# Patient Record
Sex: Female | Born: 1980
Health system: Southern US, Community
[De-identification: ages and names within clinical notes are randomized; demographics above are authoritative.]

## PROBLEM LIST (undated history)

## (undated) DIAGNOSIS — D649 Anemia, unspecified: Secondary | ICD-10-CM

## (undated) DIAGNOSIS — I10 Essential (primary) hypertension: Secondary | ICD-10-CM

## (undated) HISTORY — PX: HYSTEROSCOPY WITH D & C: SHX1775

## (undated) HISTORY — DX: Anemia, unspecified: D64.9

---

## 2010-04-27 ENCOUNTER — Emergency Department (HOSPITAL_COMMUNITY): Admission: EM | Admit: 2010-04-27 | Discharge: 2010-04-27 | Payer: Self-pay | Admitting: Emergency Medicine

## 2015-03-14 ENCOUNTER — Emergency Department (HOSPITAL_BASED_OUTPATIENT_CLINIC_OR_DEPARTMENT_OTHER)
Admission: EM | Admit: 2015-03-14 | Discharge: 2015-03-14 | Disposition: A | Discharge status: Home / Self Care | Payer: Commercial Managed Care - PPO | Attending: Emergency Medicine | Admitting: Emergency Medicine

## 2015-03-14 ENCOUNTER — Encounter (HOSPITAL_BASED_OUTPATIENT_CLINIC_OR_DEPARTMENT_OTHER): Payer: Self-pay | Admitting: *Deleted

## 2015-03-14 DIAGNOSIS — I10 Essential (primary) hypertension: Secondary | ICD-10-CM | POA: Insufficient documentation

## 2015-03-14 DIAGNOSIS — L0201 Cutaneous abscess of face: Secondary | ICD-10-CM

## 2015-03-14 DIAGNOSIS — Z87891 Personal history of nicotine dependence: Secondary | ICD-10-CM | POA: Diagnosis not present

## 2015-03-14 DIAGNOSIS — Z792 Long term (current) use of antibiotics: Secondary | ICD-10-CM | POA: Insufficient documentation

## 2015-03-14 DIAGNOSIS — Z79899 Other long term (current) drug therapy: Secondary | ICD-10-CM | POA: Insufficient documentation

## 2015-03-14 DIAGNOSIS — R220 Localized swelling, mass and lump, head: Secondary | ICD-10-CM | POA: Diagnosis present

## 2015-03-14 HISTORY — DX: Essential (primary) hypertension: I10

## 2015-03-14 NOTE — ED Notes (Signed)
Pt with swelling noted to inferior left eye with tracking noted from know abscess to left temple. pt denies any visual changes.

## 2015-03-14 NOTE — ED Provider Notes (Signed)
CSN: [id number]     Arrival date & time 03/14/15  1348 History  This chart was scribed for Kathryn Williams Keniesha Adderly, MD by Ronney LionSuzanne Le, ED Scribe. This patient was seen in room MH09/MH09 and the patient's care was started at 6:10 PM.    Chief Complaint  Patient presents with  . Facial Swelling   The history is provided by the patient. No language interpreter was used.     HPI Comments: Kathryn DeutscherSanah Tinoco is a 34 y.o. female who presents to the Emergency Department complaining of worsening, left sided facial swelling that began 3 days ago and has spread to her left eye. She had gone to her PCP, who sent her to an ENT specialist; he gave her a steroid injection and prescribed amoxicillin BID, which she has been taking. She is also taking Tylenol at home. The ENT specialist told her he thought it was a cyst.    Past Medical History  Diagnosis Date  . Hypertension    History reviewed. No pertinent past surgical history. No family history on file. History  Substance Use Topics  . Smoking status: Former Games developermoker  . Smokeless tobacco: Not on file  . Alcohol Use: No   OB History    No data available     Review of Systems  A complete 10 system review of systems was obtained and all systems are negative except as noted in the HPI and PMH.    Allergies  Review of patient's allergies indicates no known allergies.  Home Medications   Prior to Admission medications   Medication Sig Start Date End Date Taking? Authorizing Provider  amoxicillin (AMOXIL) 875 MG tablet Take 875 mg by mouth 2 (two) times daily.   Yes Historical Provider, MD  spironolactone (ALDACTONE) 25 MG tablet Take 25 mg by mouth daily.   Yes Historical Provider, MD   BP 148/90 mmHg  Pulse 93  Temp(Src) 98.8 F (37.1 C)  Resp 18  Ht 5\' 8"  (1.727 m)  Wt 250 lb (113.399 kg)  BMI 38.02 kg/m2  SpO2 100%  LMP 01/16/2015 Physical Exam  Constitutional: She is oriented to person, place, and time. She appears well-developed and  well-nourished. No distress.  HENT:  Head: Normocephalic and atraumatic.  Eyes: Conjunctivae and EOM are normal.  Neck: Neck supple. No tracheal deviation present.  Cardiovascular: Normal rate.   Pulmonary/Chest: Effort normal. No respiratory distress.  Musculoskeletal: Normal range of motion.  Neurological: She is alert and oriented to person, place, and time.  Skin: Skin is warm and dry. No rash noted.  Golf ball sized abscess with pointing, induration, and fluctuance. Warmth, without surrounding erythema. Mild swelling in the infraorbital area.   Psychiatric: She has a normal mood and affect. Her behavior is normal.  Nursing note and vitals reviewed.   ED Course  Procedures (including critical care time)  DIAGNOSTIC STUDIES: Oxygen Saturation is 100% on room air, normal by my interpretation.    COORDINATION OF CARE: 6:14 PM - Discussed treatment plan with pt at bedside which includes I&D, and pt agreed to plan.   INCISION AND DRAINAGE PROCEDURE NOTE: Patient identification was confirmed and verbal consent was obtained. This procedure was performed by Kathryn Williams Galit Urich, MD at 6:16 PM. Site: left face  Needle size: 18 Anesthetic used (type and amt): Lidocaine 2% with epi Blade size: 18-gauge needle Drainage: 5 mL Complexity: Simple and no packing used Site anesthetized, incision made over site, wound drained and explored loculations, rinsed with copious amounts of normal saline,  wound packed with sterile gauze, covered with dry, sterile dressing.  Pt tolerated procedure well without complications.  Instructions for care discussed verbally and pt provided with additional written instructions for homecare and f/u. `  Labs Review Labs Reviewed - No data to display  Imaging Review No results found.   EKG Interpretation None      MDM   Final diagnoses:  Facial abscess    Patient with a facial abscess drained with an 18-gauge needle. No significant cellulitis. I  personally performed the services described in this documentation, which was scribed in my presence.  The recorded information has been reviewed and considered.    Kathryn Sprout, MD 03/15/15 0020

## 2015-03-14 NOTE — ED Notes (Signed)
Pt with swelling between right eye and right ear.  Pt was seen by ENT for cyst and was placed on amoxicillin and given steroid injection. Pt continues to have increased swelling with swelling noted under the right eye too.  No fever or chills with this

## 2015-03-14 NOTE — Discharge Instructions (Signed)
Abscess °An abscess (boil or furuncle) is an infected area on or under the skin. This area is filled with yellowish-white fluid (pus) and other material (debris). °HOME CARE  °· Only take medicines as told by your doctor. °· If you were given antibiotic medicine, take it as directed. Finish the medicine even if you start to feel better. °· If gauze is used, follow your doctor's directions for changing the gauze. °· To avoid spreading the infection: °· Keep your abscess covered with a bandage. °· Wash your hands well. °· Do not share personal care items, towels, or whirlpools with others. °· Avoid skin contact with others. °· Keep your skin and clothes clean around the abscess. °· Keep all doctor visits as told. °GET HELP RIGHT AWAY IF:  °· You have more pain, puffiness (swelling), or redness in the wound site. °· You have more fluid or blood coming from the wound site. °· You have muscle aches, chills, or you feel sick. °· You have a fever. °MAKE SURE YOU:  °· Understand these instructions. °· Will watch your condition. °· Will get help right away if you are not doing well or get worse. °Document Released: 04/24/2008 Document Revised: 05/07/2012 Document Reviewed: 01/19/2012 °ExitCare® Patient Information ©2015 [employer]. This information is not intended to replace advice given to you by your health care provider. Make sure you discuss any questions you have with your health care provider. ° °Abscess °Care After °An abscess (also called a boil or furuncle) is an infected area that contains a collection of pus. Signs and symptoms of an abscess include pain, tenderness, redness, or hardness, or you may feel a moveable soft area under your skin. An abscess can occur anywhere in the body. The infection may spread to surrounding tissues causing cellulitis. A cut (incision) by the surgeon was made over your abscess and the pus was drained out. Gauze may have been packed into the space to provide a drain that will  allow the cavity to heal from the inside outwards. The boil may be painful for 5 to 7 days. Most people with a boil do not have high fevers. Your abscess, if seen early, may not have localized, and may not have been lanced. If not, another appointment may be required for this if it does not get better on its own or with medications. °HOME CARE INSTRUCTIONS  °· Only take over-the-counter or prescription medicines for pain, discomfort, or fever as directed by your caregiver. °· When you bathe, soak and then remove gauze or iodoform packs at least daily or as directed by your caregiver. You may then wash the wound gently with mild soapy water. Repack with gauze or do as your caregiver directs. °SEEK IMMEDIATE MEDICAL CARE IF:  °· You develop increased pain, swelling, redness, drainage, or bleeding in the wound site. °· You develop signs of generalized infection including muscle aches, chills, fever, or a general ill feeling. °· An oral temperature above 102° F (38.9° C) develops, not controlled by medication. °See your caregiver for a recheck if you develop any of the symptoms described above. If medications (antibiotics) were prescribed, take them as directed. °Document Released: 05/25/2005 Document Revised: 01/29/2012 Document Reviewed: 01/20/2008 °ExitCare® Patient Information ©2015 [employer]. This information is not intended to replace advice given to you by your health care provider. Make sure you discuss any questions you have with your health care provider. ° °

## 2017-02-28 ENCOUNTER — Ambulatory Visit
Admission: EM | Admit: 2017-02-28 | Discharge: 2017-02-28 | Disposition: A | Discharge status: Home / Self Care | Payer: BLUE CROSS/BLUE SHIELD | Attending: Family Medicine | Admitting: Family Medicine

## 2017-02-28 ENCOUNTER — Encounter: Payer: Self-pay | Admitting: *Deleted

## 2017-02-28 DIAGNOSIS — J069 Acute upper respiratory infection, unspecified: Secondary | ICD-10-CM | POA: Diagnosis not present

## 2017-02-28 DIAGNOSIS — B9789 Other viral agents as the cause of diseases classified elsewhere: Secondary | ICD-10-CM

## 2017-02-28 LAB — RAPID STREP SCREEN (MED CTR MEBANE ONLY): Streptococcus, Group A Screen (Direct): NEGATIVE

## 2017-02-28 MED ORDER — LIDOCAINE VISCOUS 2 % MT SOLN: OROMUCOSAL | 0 refills | Status: DC

## 2017-02-28 MED ORDER — HYDROCOD POLST-CPM POLST ER 10-8 MG/5ML PO SUER: 5.0000 mL | Freq: Every evening | ORAL | 0 refills | Status: DC | PRN

## 2017-02-28 NOTE — ED Triage Notes (Signed)
Sore throat, runny nose- clear, head/chest congestion, since yesterday.

## 2017-02-28 NOTE — ED Provider Notes (Signed)
[facility]    CSN: [id number] Arrival date & time: 02/28/17  1718     History   Chief Complaint Chief Complaint  Patient presents with  . Sore Throat  . Cough  . Nasal Congestion    HPI Kathryn Williams is a 36 y.o. female.   The history is provided by the patient.  Sore Throat  Pertinent negatives include no headaches.  Cough  Associated symptoms: no headaches, no myalgias and no wheezing   URI  Presenting symptoms: congestion and cough   Severity:  Moderate Onset quality:  Sudden Duration:  2 days Timing:  Constant Progression:  Worsening Chronicity:  New Relieved by:  None tried Associated symptoms: no arthralgias, no headaches, no myalgias, no sinus pain and no wheezing   Risk factors: sick contacts   Risk factors: not elderly, no chronic cardiac disease, no chronic kidney disease, no chronic respiratory disease, no diabetes mellitus, no immunosuppression, no recent illness and no recent travel     Past Medical History:  Diagnosis Date  . Hypertension     There are no active problems to display for this patient.   History reviewed. No pertinent surgical history.  OB History    No data available       Home Medications    Prior to Admission medications   Medication Sig Start Date End Date Taking? Authorizing Provider  spironolactone (ALDACTONE) 25 MG tablet Take 25 mg by mouth daily.   Yes Historical Provider, MD  amoxicillin (AMOXIL) 875 MG tablet Take 875 mg by mouth 2 (two) times daily.    Historical Provider, MD  chlorpheniramine-HYDROcodone (TUSSIONEX PENNKINETIC ER) 10-8 MG/5ML SUER Take 5 mLs by mouth at bedtime as needed. 02/28/17   Payton Mccallum, MD  lidocaine (XYLOCAINE) 2 % solution 20 ml gargle and spit q 6 hours prn 02/28/17   Payton Mccallum, MD    Family History History reviewed. No pertinent family history.  Social History Social History  Substance Use Topics  . Smoking status: Former Games developer  . Smokeless tobacco: Never Used    . Alcohol use No     Allergies   Patient has no known allergies.   Review of Systems Review of Systems  HENT: Positive for congestion. Negative for sinus pain.   Respiratory: Positive for cough. Negative for wheezing.   Musculoskeletal: Negative for arthralgias and myalgias.  Neurological: Negative for headaches.     Physical Exam Triage Vital Signs ED Triage Vitals  Enc Vitals Group     BP 02/28/17 1738 137/89     Pulse Rate 02/28/17 1738 94     Resp 02/28/17 1738 16     Temp 02/28/17 1738 98.5 F (36.9 C)     Temp Source 02/28/17 1738 Oral     SpO2 02/28/17 1738 100 %     Weight 02/28/17 1740 250 lb (113.4 kg)     Height 02/28/17 1740  (1.727 m)     Head Circumference --      Peak Flow --      Pain Score --      Pain Loc --      Pain Edu? --      Excl. in GC? --    No data found.   Updated Vital Signs BP 137/89 (BP Location: Left Arm)   Pulse 94   Temp 98.5 F (36.9 C) (Oral)   Resp 16   Ht  (1.727 m)   Wt 250 lb (113.4 kg)  LMP 02/14/2017 (Approximate)   SpO2 100%   BMI 38.01 kg/m   Visual Acuity Right Eye Distance:   Left Eye Distance:   Bilateral Distance:    Right Eye Near:   Left Eye Near:    Bilateral Near:     Physical Exam  Constitutional: She appears well-developed and well-nourished. No distress.  HENT:  Head: Normocephalic and atraumatic.  Right Ear: Tympanic membrane, external ear and ear canal normal.  Left Ear: Tympanic membrane, external ear and ear canal normal.  Nose: Mucosal edema and rhinorrhea present. No nose lacerations, sinus tenderness, nasal deformity, septal deviation or nasal septal hematoma. No epistaxis.  No foreign bodies. Right sinus exhibits no maxillary sinus tenderness and no frontal sinus tenderness. Left sinus exhibits no maxillary sinus tenderness and no frontal sinus tenderness.  Mouth/Throat: Uvula is midline, oropharynx is clear and moist and mucous membranes are normal. No oropharyngeal  exudate.  Eyes: Conjunctivae and EOM are normal. Pupils are equal, round, and reactive to light. Right eye exhibits no discharge. Left eye exhibits no discharge. No scleral icterus.  Neck: Normal range of motion. Neck supple. No thyromegaly present.  Cardiovascular: Normal rate, regular rhythm and normal heart sounds.   Pulmonary/Chest: Effort normal and breath sounds normal. No respiratory distress. She has no wheezes. She has no rales.  Lymphadenopathy:    She has no cervical adenopathy.  Skin: She is not diaphoretic.  Nursing note and vitals reviewed.    UC Treatments / Results  Labs (all labs ordered are listed, but only abnormal results are displayed) Labs Reviewed  RAPID STREP SCREEN (NOT AT [facility])  CULTURE, GROUP A STREP [facility])    EKG  EKG Interpretation None       Radiology No results found.  Procedures Procedures (including critical care time)  Medications Ordered in UC Medications - No data to display   Initial Impression / Assessment and Plan / UC Course  I have reviewed the triage vital signs and the nursing notes.  Pertinent labs & imaging results that were available during my care of the patient were reviewed by me and considered in my medical decision making (see chart for details).       Final Clinical Impressions(s) / UC Diagnoses   Final diagnoses:  Viral URI with cough    New Prescriptions Discharge Medication List as of 02/28/2017  6:19 PM    START taking these medications   Details  chlorpheniramine-HYDROcodone (TUSSIONEX PENNKINETIC ER) 10-8 MG/5ML SUER Take 5 mLs by mouth at bedtime as needed., Starting Wed 02/28/2017, Normal    lidocaine (XYLOCAINE) 2 % solution 20 ml gargle and spit q 6 hours prn, Normal       1. Lab results and diagnosis reviewed with patient 2. rx as per orders above; reviewed possible side effects, interactions, risks and benefits  3. Recommend supportive treatment with otc analgesics prn, increased fluids 4.  Follow-up prn if symptoms worsen or don't improve   Payton Mccallum, MD 02/28/17 1936

## 2017-03-03 LAB — CULTURE, GROUP A STREP (THRC)

## 2020-09-21 ENCOUNTER — Ambulatory Visit (HOSPITAL_COMMUNITY)
Admission: EM | Admit: 2020-09-21 | Discharge: 2020-09-21 | Disposition: A | Discharge status: Home / Self Care | Payer: 59 | Attending: Family Medicine | Admitting: Family Medicine

## 2020-09-21 ENCOUNTER — Encounter (HOSPITAL_COMMUNITY): Payer: Self-pay | Admitting: Emergency Medicine

## 2020-09-21 ENCOUNTER — Other Ambulatory Visit: Payer: Self-pay

## 2020-09-21 DIAGNOSIS — Z87891 Personal history of nicotine dependence: Secondary | ICD-10-CM | POA: Insufficient documentation

## 2020-09-21 DIAGNOSIS — Z20822 Contact with and (suspected) exposure to covid-19: Secondary | ICD-10-CM | POA: Diagnosis not present

## 2020-09-21 DIAGNOSIS — J069 Acute upper respiratory infection, unspecified: Secondary | ICD-10-CM | POA: Diagnosis not present

## 2020-09-21 LAB — SARS CORONAVIRUS 2 (TAT 6-24 HRS): SARS Coronavirus 2: NEGATIVE

## 2020-09-21 NOTE — ED Triage Notes (Addendum)
Symptoms started yesterday morning.  Complains of runny nose, sore throat, headache, body aches, and diarrhea  Requesting a work note and covid test for work

## 2020-09-21 NOTE — ED Provider Notes (Signed)
[facility]    CSN: [id number] Arrival date & time: 09/21/20  1417      History   Chief Complaint Chief Complaint  Patient presents with  . Fatigue  . Nasal Congestion    HPI Kathryn Williams is a 39 y.o. female.   Patient presenting today with 2 day hx of fatigue, congestion, sore throat, body aches, chills, cough. Denies known fever, abdominal pain, N/V/D, CP, SOB. Has tried some zyrtec without much relief. Daughter sick with similar sxs right now. No known hx of allergic rhinitis, asthma, or other pulmonary conditions.      Past Medical History:  Diagnosis Date  . Hypertension     There are no problems to display for this patient.   Past Surgical History:  Procedure Laterality Date  . HYSTEROSCOPY WITH D & C      OB History   No obstetric history on file.      Home Medications    Prior to Admission medications   Medication Sig Start Date End Date Taking? Authorizing Provider  amoxicillin (AMOXIL) 875 MG tablet Take 875 mg by mouth 2 (two) times daily.    [provider]  chlorpheniramine-HYDROcodone (TUSSIONEX PENNKINETIC ER) 10-8 MG/5ML SUER Take 5 mLs by mouth at bedtime as needed. 02/28/17   Payton Mccallum, MD  lidocaine (XYLOCAINE) 2 % solution 20 ml gargle and spit q 6 hours prn 02/28/17   Payton Mccallum, MD  spironolactone (ALDACTONE) 25 MG tablet Take 25 mg by mouth daily.    [provider]    Family History Family History  Problem Relation Age of Onset  . Hypertension Mother   . Thyroid disease Mother   . Hyperlipidemia Father     Social History Social History   Tobacco Use  . Smoking status: Former Games developer  . Smokeless tobacco: Never Used  Substance Use Topics  . Alcohol use: No  . Drug use: Never     Allergies   Patient has no known allergies.   Review of Systems Review of Systems PER HPI   Physical Exam Triage Vital Signs ED Triage Vitals  Enc Vitals Group     BP 09/21/20 1501 (!) 132/97      Pulse Rate 09/21/20 1501 97     Resp 09/21/20 1501 20     Temp 09/21/20 1501 99.1 F (37.3 C)     Temp Source 09/21/20 1501 Oral     SpO2 09/21/20 1501 100 %     Weight --      Height --      Head Circumference --      Peak Flow --      Pain Score 09/21/20 1456 4     Pain Loc --      Pain Edu? --      Excl. in GC? --    No data found.  Updated Vital Signs BP (!) 132/97 (BP Location: Left Arm) Comment (BP Location): large cuff  Pulse 97   Temp 99.1 F (37.3 C) (Oral)   Resp 20   LMP 09/03/2020   SpO2 100%   Visual Acuity Right Eye Distance:   Left Eye Distance:   Bilateral Distance:    Right Eye Near:   Left Eye Near:    Bilateral Near:     Physical Exam Vitals and nursing note reviewed.  Constitutional:      Appearance: Normal appearance. She is not ill-appearing.  HENT:     Head: Atraumatic.  Right Ear: Tympanic membrane normal.     Left Ear: Tympanic membrane normal.     Nose: Rhinorrhea present.     Mouth/Throat:     Mouth: Mucous membranes are moist.     Pharynx: Posterior oropharyngeal erythema present.  Eyes:     Extraocular Movements: Extraocular movements intact.     Conjunctiva/sclera: Conjunctivae normal.  Cardiovascular:     Rate and Rhythm: Normal rate and regular rhythm.     Heart sounds: Normal heart sounds.  Pulmonary:     Effort: Pulmonary effort is normal.     Breath sounds: Normal breath sounds. No wheezing or rales.  Abdominal:     General: Bowel sounds are normal. There is no distension.     Palpations: Abdomen is soft.     Tenderness: There is no abdominal tenderness. There is no guarding.  Musculoskeletal:        General: Normal range of motion.     Cervical back: Normal range of motion and neck supple.  Skin:    General: Skin is warm and dry.  Neurological:     Mental Status: She is alert and oriented to person, place, and time.  Psychiatric:        Mood and Affect: Mood normal.        Thought Content: Thought content  normal.        Judgment: Judgment normal.      UC Treatments / Results  Labs (all labs ordered are listed, but only abnormal results are displayed) Labs Reviewed  SARS CORONAVIRUS 2 (TAT 6-24 HRS)    EKG   Radiology No results found.  Procedures Procedures (including critical care time)  Medications Ordered in UC Medications - No data to display  Initial Impression / Assessment and Plan / UC Course  I have reviewed the triage vital signs and the nursing notes.  Pertinent labs & imaging results that were available during my care of the patient were reviewed by me and considered in my medical decision making (see chart for details).     Consistent with viral URI, vitals and exam reassuring. Discussed OTC remedies and supportive care. COVID pcr pending, work note given with isolation protocol reviewed. Return if sxs worsening or not resolving.  Final Clinical Impressions(s) / UC Diagnoses   Final diagnoses:  Viral URI with cough   Discharge Instructions   None    ED Prescriptions    None     PDMP not reviewed this encounter.   Particia Nearing, [place] 09/21/20 1606

## 2021-02-02 LAB — LIPID PANEL
Cholesterol: 141 (ref 0–200)
HDL: 42 (ref 35–70)
LDL Cholesterol: 83
LDl/HDL Ratio: 3.4
Triglycerides: 80 (ref 40–160)

## 2021-02-02 LAB — BASIC METABOLIC PANEL
BUN: 13 (ref 4–21)
CO2: 22 (ref 13–22)
Chloride: 102 (ref 99–108)
Creatinine: 0.7 (ref 0.5–1.1)
Glucose: 82
Potassium: 4.2 mEq/L (ref 3.5–5.1)
Sodium: 138 (ref 137–147)

## 2021-02-02 LAB — HEPATIC FUNCTION PANEL
ALT: 13 U/L (ref 7–35)
AST: 16 (ref 13–35)
Alkaline Phosphatase: 103 (ref 25–125)
Bilirubin, Total: 0.3

## 2021-02-02 LAB — COMPREHENSIVE METABOLIC PANEL
Albumin: 4.2 (ref 3.5–5.0)
Calcium: 8.9 (ref 8.7–10.7)

## 2021-02-02 LAB — CBC AND DIFFERENTIAL
HCT: 37 (ref 36–46)
Hemoglobin: 11.7 — AB (ref 12.0–16.0)
Platelets: 388 10*3/uL (ref 150–400)
WBC: 9.4

## 2021-02-02 LAB — CBC: RBC: 5.04 (ref 3.87–5.11)

## 2021-02-02 LAB — TSH: TSH: 1.65 (ref 0.41–5.90)

## 2021-08-10 ENCOUNTER — Other Ambulatory Visit: Payer: Self-pay

## 2021-08-10 ENCOUNTER — Ambulatory Visit (HOSPITAL_COMMUNITY)
Admission: RE | Admit: 2021-08-10 | Discharge: 2021-08-10 | Disposition: A | Discharge status: Home / Self Care | Payer: PRIVATE HEALTH INSURANCE | Source: Ambulatory Visit | Attending: Emergency Medicine | Admitting: Emergency Medicine

## 2021-08-10 ENCOUNTER — Ambulatory Visit (INDEPENDENT_AMBULATORY_CARE_PROVIDER_SITE_OTHER): Payer: PRIVATE HEALTH INSURANCE

## 2021-08-10 VITALS — BP 118/70 | HR 95 | Temp 98.9°F | Resp 17

## 2021-08-10 DIAGNOSIS — B349 Viral infection, unspecified: Secondary | ICD-10-CM

## 2021-08-10 DIAGNOSIS — R0602 Shortness of breath: Secondary | ICD-10-CM

## 2021-08-10 DIAGNOSIS — R059 Cough, unspecified: Secondary | ICD-10-CM

## 2021-08-10 MED ORDER — PROMETHAZINE-DM 6.25-15 MG/5ML PO SYRP: 5.0000 mL | ORAL_SOLUTION | Freq: Four times a day (QID) | ORAL | 0 refills | Status: DC | PRN

## 2021-08-10 MED ORDER — BENZONATATE 100 MG PO CAPS: 200.0000 mg | ORAL_CAPSULE | Freq: Three times a day (TID) | ORAL | 0 refills | Status: DC | PRN

## 2021-08-10 NOTE — Discharge Instructions (Signed)
You can take the [facility] as needed for cough You can take Promethazine DM as needed for cough.  Do not take this before driving as this can make you sleepy.  You can take Tylenol and/or Ibuprofen as needed for fever reduction and pain relief.   For cough: honey 1/2 to 1 teaspoon (you can dilute the honey in water or another fluid).  You can also use guaifenesin and dextromethorphan for cough. You can use a humidifier for chest congestion and cough.  If you don't have a humidifier, you can sit in the bathroom with the hot shower running.     For sore throat: try warm salt water gargles, cepacol lozenges, throat spray, warm tea or water with lemon/honey, popsicles or ice, or OTC cold relief medicine for throat discomfort.    For congestion: take a daily anti-histamine like Zyrtec, Claritin, and a oral decongestant, such as pseudoephedrine.  You can also use Flonase 1-2 sprays in each nostril daily.    It is important to stay hydrated: drink plenty of fluids (water, gatorade/powerade/pedialyte, juices, or teas) to keep your throat moisturized and help further relieve irritation/discomfort.   Return or go to the Emergency Department if symptoms worsen or do not improve in the next few days.

## 2021-08-10 NOTE — ED Triage Notes (Signed)
Pt presents with non productive cough that causes chest tightness since yesterday.

## 2021-08-10 NOTE — ED Provider Notes (Signed)
[facility]    CSN: [id number] Arrival date & time: 08/10/21  1657      History   Chief Complaint Chief Complaint  Patient presents with   APPOINTMENT : Cough    HPI Kathryn Williams is a 40 y.o. female.   Patient here for evaluation of cough, shortness of breath, and chest tightness that has been ongoing since yesterday.  Patient reports daughter had similar symptoms last week and was diagnosed with RSV.  Reports shortness of breath and chest tightness are worse when coughing.  Reports taking an at home COVID test that was negative.  Reports using OTC medication with minimal symptom relief.  Denies any trauma, injury, or other precipitating event.  Denies any specific alleviating or aggravating factors.  Denies any fevers, chest pain, N/V/D, numbness, tingling, weakness, abdominal pain, or headaches.    The history is provided by the patient.   Past Medical History:  Diagnosis Date   Hypertension     There are no problems to display for this patient.   Past Surgical History:  Procedure Laterality Date   HYSTEROSCOPY WITH D & C      OB History   No obstetric history on file.      Home Medications    Prior to Admission medications   Medication Sig Start Date End Date Taking? Authorizing Provider  benzonatate (TESSALON PERLES) 100 MG capsule Take 2 capsules (200 mg total) by mouth 3 (three) times daily as needed for cough. 08/10/21  Yes Ivette Loyal, NP  promethazine-dextromethorphan (PROMETHAZINE-DM) 6.25-15 MG/5ML syrup Take 5 mLs by mouth 4 (four) times daily as needed for cough. 08/10/21  Yes Ivette Loyal, NP  amoxicillin (AMOXIL) 875 MG tablet Take 875 mg by mouth 2 (two) times daily.    [provider]  lidocaine (XYLOCAINE) 2 % solution 20 ml gargle and spit q 6 hours prn 02/28/17   Payton Mccallum, MD  spironolactone (ALDACTONE) 25 MG tablet Take 25 mg by mouth daily.    [provider]    Family History Family History  Problem  Relation Age of Onset   Hypertension Mother    Thyroid disease Mother    Hyperlipidemia Father     Social History Social History   Tobacco Use   Smoking status: Former   Smokeless tobacco: Never  Substance Use Topics   Alcohol use: No   Drug use: Never     Allergies   Patient has no known allergies.   Review of Systems Review of Systems  HENT:  Positive for congestion.   Respiratory:  Positive for cough, chest tightness and shortness of breath.   All other systems reviewed and are negative.   Physical Exam Triage Vital Signs ED Triage Vitals  Enc Vitals Group     BP 08/10/21 1728 118/70     Pulse Rate 08/10/21 1727 95     Resp 08/10/21 1727 17     Temp 08/10/21 1727 98.9 F (37.2 C)     Temp Source 08/10/21 1727 Oral     SpO2 08/10/21 1727 100 %     Weight --      Height --      Head Circumference --      Peak Flow --      Pain Score 08/10/21 1731 2     Pain Loc --      Pain Edu? --      Excl. in [place]? --    No data found.  Updated Vital Signs BP 118/70   Pulse 95   Temp 98.9 F (37.2 C) (Oral)   Resp 17   LMP 08/01/2021   SpO2 100%   Visual Acuity Right Eye Distance:   Left Eye Distance:   Bilateral Distance:    Right Eye Near:   Left Eye Near:    Bilateral Near:     Physical Exam Vitals and nursing note reviewed.  Constitutional:      General: She is not in acute distress.    Appearance: Normal appearance. She is not ill-appearing, toxic-appearing or diaphoretic.  HENT:     Head: Normocephalic and atraumatic.     Nose: Congestion present.     Mouth/Throat:     Pharynx: No oropharyngeal exudate or posterior oropharyngeal erythema.  Eyes:     Conjunctiva/sclera: Conjunctivae normal.  Cardiovascular:     Rate and Rhythm: Normal rate.     Pulses: Normal pulses.     Heart sounds: Normal heart sounds.  Pulmonary:     Effort: Pulmonary effort is normal.     Breath sounds: Wheezing present. No rales.  Chest:     Chest wall: No  tenderness.  Abdominal:     General: Abdomen is flat.  Musculoskeletal:        General: Normal range of motion.     Cervical back: Normal range of motion.  Skin:    General: Skin is warm and dry.  Neurological:     General: No focal deficit present.     Mental Status: She is alert and oriented to person, place, and time.  Psychiatric:        Mood and Affect: Mood normal.     UC Treatments / Results  Labs (all labs ordered are listed, but only abnormal results are displayed) Labs Reviewed - No data to display  EKG   Radiology DG Chest 2 View  Result Date: 08/10/2021 CLINICAL DATA:  Cough and shortness of breath. EXAM: CHEST - 2 VIEW COMPARISON:  Chest CT 05/01/2021 FINDINGS: The cardiomediastinal contours are normal. Previous right lung airspace disease has resolved. Pulmonary vasculature is normal. No consolidation, pleural effusion, or pneumothorax. No acute osseous abnormalities are seen. Slight wedging of L1 vertebra, appearing chronic. IMPRESSION: No acute chest findings. Previous right lung airspace disease has resolved. Electronically Signed   By: Narda Rutherford M.D.   On: 08/10/2021 18:48    Procedures Procedures (including critical care time)  Medications Ordered in UC Medications - No data to display  Initial Impression / Assessment and Plan / UC Course  I have reviewed the triage vital signs and the nursing notes.  Pertinent labs & imaging results that were available during my care of the patient were reviewed by me and considered in my medical decision making (see chart for details).    Assessment negative for red flags or concerns.  X-ray with no acute cardiopulmonary disease.  This is likely a viral illness.  Recommend Tylenol and/or ibuprofen as needed.  Encourage fluids and rest.  Discussed conservative symptom management as described in discharge instructions.  Follow-up with primary care for reevaluation of soon as possible.  ED follow-up for any red flag  symptoms. Final Clinical Impressions(s) / UC Diagnoses   Final diagnoses:  Viral illness     Discharge Instructions      You can take the Tessalon Perles as needed for cough You can take Promethazine DM as needed for cough.  Do not take this before driving as this can make  you sleepy.  You can take Tylenol and/or Ibuprofen as needed for fever reduction and pain relief.   For cough: honey 1/2 to 1 teaspoon (you can dilute the honey in water or another fluid).  You can also use guaifenesin and dextromethorphan for cough. You can use a humidifier for chest congestion and cough.  If you don't have a humidifier, you can sit in the bathroom with the hot shower running.     For sore throat: try warm salt water gargles, cepacol lozenges, throat spray, warm tea or water with lemon/honey, popsicles or ice, or OTC cold relief medicine for throat discomfort.    For congestion: take a daily anti-histamine like Zyrtec, Claritin, and a oral decongestant, such as pseudoephedrine.  You can also use Flonase 1-2 sprays in each nostril daily.    It is important to stay hydrated: drink plenty of fluids (water, gatorade/powerade/pedialyte, juices, or teas) to keep your throat moisturized and help further relieve irritation/discomfort.   Return or go to the Emergency Department if symptoms worsen or do not improve in the next few days.     ED Prescriptions     Medication Sig Dispense Auth. Provider   benzonatate (TESSALON PERLES) 100 MG capsule Take 2 capsules (200 mg total) by mouth 3 (three) times daily as needed for cough. 20 capsule Ivette Loyal, NP   promethazine-dextromethorphan (PROMETHAZINE-DM) 6.25-15 MG/5ML syrup Take 5 mLs by mouth 4 (four) times daily as needed for cough. 118 mL Ivette Loyal, NP      PDMP not reviewed this encounter.   Ivette Loyal, NP 08/10/21 1910

## 2021-10-24 ENCOUNTER — Ambulatory Visit (HOSPITAL_COMMUNITY)
Admission: EM | Admit: 2021-10-24 | Discharge: 2021-10-24 | Disposition: A | Discharge status: Home / Self Care | Payer: PRIVATE HEALTH INSURANCE | Attending: Emergency Medicine | Admitting: Emergency Medicine

## 2021-10-24 ENCOUNTER — Ambulatory Visit (HOSPITAL_COMMUNITY): Payer: Self-pay

## 2021-10-24 ENCOUNTER — Other Ambulatory Visit: Payer: Self-pay

## 2021-10-24 ENCOUNTER — Encounter (HOSPITAL_COMMUNITY): Payer: Self-pay

## 2021-10-24 DIAGNOSIS — J111 Influenza due to unidentified influenza virus with other respiratory manifestations: Secondary | ICD-10-CM | POA: Diagnosis not present

## 2021-10-24 MED ORDER — DM-GUAIFENESIN ER 30-600 MG PO TB12: 2.0000 | ORAL_TABLET | Freq: Two times a day (BID) | ORAL | 0 refills | Status: DC

## 2021-10-24 MED ORDER — OSELTAMIVIR PHOSPHATE 75 MG PO CAPS: 75.0000 mg | ORAL_CAPSULE | Freq: Two times a day (BID) | ORAL | 0 refills | Status: DC

## 2021-10-24 NOTE — Discharge Instructions (Signed)
They are most likely being caused by influenza A, your symptoms are consistent with the current presentation, influenza is a virus and will steadily improve with time  You may take Tamiflu twice daily for the next 5 days, if you are able to get this medication from the pharmacy then continue supportive treatment, symptoms will improve as the virus works as well after system whether or not you take this medication    You can take Tylenol and/or Ibuprofen as needed for fever reduction and pain relief.   For cough: honey 1/2 to 1 teaspoon (you can dilute the honey in water or another fluid). You can use a humidifier for chest congestion and cough.  If you don't have a humidifier, you can sit in the bathroom with the hot shower running.      For sore throat: try warm salt water gargles, cepacol lozenges, throat spray, warm tea or water with lemon/honey, popsicles or ice, or OTC cold relief medicine for throat discomfort.   For congestion: take a daily anti-histamine like Zyrtec, Claritin, and a oral decongestant, such as pseudoephedrine.  You can also use Flonase 1-2 sprays in each nostril daily.   It is important to stay hydrated: drink plenty of fluids (water, gatorade/powerade/pedialyte, juices, or teas) to keep your throat moisturized and help further relieve irritation/discomfort.

## 2021-10-24 NOTE — ED Provider Notes (Signed)
[facility]    CSN: [id number] Arrival date & time: 10/24/21  1040      History   Chief Complaint Chief Complaint  Patient presents with   Fever   Cough   Nasal Congestion    HPI Kathryn Williams is a 40 y.o. female.   Patient presents with fever, nasal congestion, sore throat, nonproductive cough and increased shortness of breath due to inability to take breath through nose. using antihistamine, which was helpful for treatment of rhinorrhea. Known sick contacts. No pertinent history, denies hypertension. Poor appetite tolerating fluids.   Past Medical History:  Diagnosis Date   Hypertension     There are no problems to display for this patient.   Past Surgical History:  Procedure Laterality Date   HYSTEROSCOPY WITH D & C      OB History   No obstetric history on file.      Home Medications    Prior to Admission medications   Medication Sig Start Date End Date Taking? Authorizing Provider  amoxicillin (AMOXIL) 875 MG tablet Take 875 mg by mouth 2 (two) times daily.    [provider]  benzonatate (TESSALON PERLES) 100 MG capsule Take 2 capsules (200 mg total) by mouth 3 (three) times daily as needed for cough. 08/10/21   Ivette Loyal, NP  lidocaine (XYLOCAINE) 2 % solution 20 ml gargle and spit q 6 hours prn 02/28/17   Payton Mccallum, MD  promethazine-dextromethorphan (PROMETHAZINE-DM) 6.25-15 MG/5ML syrup Take 5 mLs by mouth 4 (four) times daily as needed for cough. 08/10/21   Ivette Loyal, NP  spironolactone (ALDACTONE) 25 MG tablet Take 25 mg by mouth daily.    [provider]    Family History Family History  Problem Relation Age of Onset   Hypertension Mother    Thyroid disease Mother    Hyperlipidemia Father     Social History Social History   Tobacco Use   Smoking status: Former   Smokeless tobacco: Never  Substance Use Topics   Alcohol use: No   Drug use: Never     Allergies   Patient has no known  allergies.   Review of Systems Review of Systems  Constitutional:  Positive for fever. Negative for activity change, appetite change, chills, diaphoresis, fatigue and unexpected weight change.  HENT:  Positive for congestion and sore throat. Negative for dental problem, drooling, ear discharge, ear pain, facial swelling, hearing loss, mouth sores, nosebleeds, postnasal drip, rhinorrhea, sinus pressure, sinus pain, sneezing, tinnitus, trouble swallowing and voice change.   Respiratory:  Positive for cough and shortness of breath. Negative for apnea, choking, chest tightness, wheezing and stridor.   Cardiovascular: Negative.   Gastrointestinal:  Positive for nausea. Negative for abdominal distention, abdominal pain, anal bleeding, blood in stool, constipation, diarrhea, rectal pain and vomiting.  Skin: Negative.   Neurological: Negative.     Physical Exam Triage Vital Signs ED Triage Vitals  Enc Vitals Group     BP 10/24/21 1126 140/88     Pulse Rate 10/24/21 1126 86     Resp 10/24/21 1126 16     Temp 10/24/21 1126 98.7 F (37.1 C)     Temp Source 10/24/21 1126 Oral     SpO2 10/24/21 1126 100 %     Weight --      Height --      Head Circumference --      Peak Flow --      Pain Score 10/24/21 1127 0  Pain Loc --      Pain Edu? --      Excl. in GC? --    No data found.  Updated Vital Signs BP 140/88 (BP Location: Left Arm)   Pulse 86   Temp 98.7 F (37.1 C) (Oral)   Resp 16   LMP 09/24/2021 (Approximate)   SpO2 100%   Visual Acuity Right Eye Distance:   Left Eye Distance:   Bilateral Distance:    Right Eye Near:   Left Eye Near:    Bilateral Near:     Physical Exam Constitutional:      Appearance: Normal appearance. She is normal weight.  HENT:     Head: Normocephalic.     Right Ear: Tympanic membrane, ear canal and external ear normal.     Left Ear: Tympanic membrane, ear canal and external ear normal.     Nose: Congestion present. No rhinorrhea.      Mouth/Throat:     Mouth: Mucous membranes are moist.     Pharynx: Oropharynx is clear.  Eyes:     Extraocular Movements: Extraocular movements intact.  Cardiovascular:     Rate and Rhythm: Normal rate and regular rhythm.     Pulses: Normal pulses.     Heart sounds: Normal heart sounds.  Pulmonary:     Effort: Pulmonary effort is normal.     Breath sounds: Normal breath sounds.  Musculoskeletal:     Cervical back: Normal range of motion.  Lymphadenopathy:     Cervical: Cervical adenopathy present.  Skin:    General: Skin is warm and dry.  Neurological:     Mental Status: She is alert and oriented to person, place, and time. Mental status is at baseline.  Psychiatric:        Mood and Affect: Mood normal.        Behavior: Behavior normal.     UC Treatments / Results  Labs (all labs ordered are listed, but only abnormal results are displayed) Labs Reviewed - No data to display  EKG   Radiology No results found.  Procedures Procedures (including critical care time)  Medications Ordered in UC Medications - No data to display  Initial Impression / Assessment and Plan / UC Course  I have reviewed the triage vital signs and the nursing notes.  Pertinent labs & imaging results that were available during my care of the patient were reviewed by me and considered in my medical decision making (see chart for details).  Influenza-like illness  1.  Tamiflu 75 mg twice daily for 5 days 2.  Mucinex DM 1200 mg-60 twice daily as needed 3.  Over-the-counter medications for remaining symptom management 4.  Urgent care follow-up as needed 5.  Work note given Final Clinical Impressions(s) / UC Diagnoses   Final diagnoses:  None   Discharge Instructions   None    ED Prescriptions   None    PDMP not reviewed this encounter.   Valinda Hoar, NP 10/24/21 1204

## 2021-10-24 NOTE — ED Triage Notes (Signed)
Pt presents to the urgent care for cough,congestion and fatigue for a couple of days.

## 2021-12-09 ENCOUNTER — Other Ambulatory Visit: Payer: Self-pay

## 2021-12-09 ENCOUNTER — Ambulatory Visit
Admission: EM | Admit: 2021-12-09 | Discharge: 2021-12-09 | Disposition: A | Discharge status: Home / Self Care | Payer: Worker's Compensation

## 2021-12-09 ENCOUNTER — Encounter: Payer: Self-pay | Admitting: Emergency Medicine

## 2021-12-09 ENCOUNTER — Ambulatory Visit: Payer: PRIVATE HEALTH INSURANCE | Attending: Emergency Medicine

## 2021-12-09 DIAGNOSIS — M79642 Pain in left hand: Secondary | ICD-10-CM

## 2021-12-09 DIAGNOSIS — M79641 Pain in right hand: Secondary | ICD-10-CM

## 2021-12-09 MED ORDER — IBUPROFEN 800 MG PO TABS
800.0000 mg | ORAL_TABLET | Freq: Once | ORAL | Status: AC
  Administered 2021-12-09: 800 mg via ORAL

## 2021-12-09 MED ORDER — IBUPROFEN 800 MG PO TABS
800.0000 mg | ORAL_TABLET | Freq: Three times a day (TID) | ORAL | 0 refills | Status: DC
Start: 2021-12-09 — End: 2022-05-09

## 2021-12-09 NOTE — ED Provider Notes (Addendum)
[facility]    CSN: [id number] Arrival date & time: 12/09/21  1820      History   Chief Complaint Chief Complaint  Patient presents with   Hand Pain    right   workers comp    HPI Kathryn Williams is a 41 y.o. female.   HPI  Hand Pain: Patient presents with a Workmen's Comp. case.  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  She describes the event as them pushing the patient in a wheelchair with all hands on deck to try to keep him restrained safely for transport.  She reports that they were using a wheelchair that has the squeeze break.  She states that in the process of moving him one of the other staff members accidentally slammed her left hand in the breaking mechanism.  She immediately experienced pain of all of her fingers and hands.  She reports that the most pain is in her third through fifth fingers.  Pain does extend into the hand.  She does have a soreness sensation that runs up her arm as well.  She has not tried anything for symptoms.  She does report mild amount of numbness and tingling in fingers but does have good sensation and capillary refill.  Past Medical History:  Diagnosis Date   Hypertension     There are no problems to display for this patient.   Past Surgical History:  Procedure Laterality Date   HYSTEROSCOPY WITH D & C      OB History   No obstetric history on file.      Home Medications    Prior to Admission medications   Medication Sig Start Date End Date Taking? Authorizing Provider  Semaglutide,0.25 or 0.5MG /DOS, (OZEMPIC, 0.25 OR 0.5 MG/DOSE,) 2 MG/1.5ML SOPN 0.25-0.5 mg 02/09/21  Yes [provider]  amoxicillin (AMOXIL) 875 MG tablet Take 875 mg by mouth 2 (two) times daily.    [provider]  benzonatate (TESSALON PERLES) 100 MG capsule Take 2 capsules (200 mg total) by mouth 3 (three) times daily as needed for cough. 08/10/21   Pearson Forster, NP  dextromethorphan-guaiFENesin [facility] DM) 30-600 MG 12hr tablet Take 2 tablets by mouth 2 (two) times daily. 10/24/21   Hans Eden, NP  lidocaine (XYLOCAINE) 2 % solution 20 ml gargle and spit q 6 hours prn 02/28/17   Norval Gable, MD  oseltamivir (TAMIFLU) 75 MG capsule Take 1 capsule (75 mg total) by mouth every 12 (twelve) hours. 10/24/21   Hans Eden, NP  promethazine-dextromethorphan (PROMETHAZINE-DM) 6.25-15 MG/5ML syrup Take 5 mLs by mouth 4 (four) times daily as needed for cough. 08/10/21   Pearson Forster, NP  spironolactone (ALDACTONE) 25 MG tablet Take 25 mg by mouth daily.    [provider]    Family History Family History  Problem Relation Age of Onset   Hypertension Mother    Thyroid disease Mother    Hyperlipidemia Father     Social History Social History   Tobacco Use   Smoking status: Former   Smokeless tobacco: Never  Scientific laboratory technician Use: Never used  Substance Use Topics   Alcohol use: No   Drug use: Never     Allergies   Patient has no known allergies.   Review of Systems Review of Systems  As stated above in HPI Physical Exam Triage Vital Signs ED Triage Vitals  Enc Vitals Group     BP 12/09/21 1849 131/78     Pulse Rate 12/09/21 1849 89     Resp 12/09/21 1849 18     Temp 12/09/21 1849 98.7 F (37.1 C)     Temp Source 12/09/21 1849 Oral     SpO2 12/09/21 1849 99 %     Weight 12/09/21 1846 250 lb (113.4 kg)     Height 12/09/21 1846 5\' 8"  (1.727 m)     Head Circumference --      Peak Flow --      Pain Score 12/09/21 1846 7     Pain Loc --      Pain Edu? --      Excl. in [place]? --    No data found.  Updated Vital Signs BP 131/78 (BP Location: Left Arm)    Pulse 89    Temp 98.7 F (37.1 C) (Oral)    Resp 18    Ht 5\' 8"  (1.727 m)    Wt 250 lb (113.4 kg)    LMP 11/28/2021 (Approximate)    SpO2 99%    BMI 38.01 kg/m   Physical Exam Vitals and nursing note reviewed.  Constitutional:      General: She is not  in acute distress.    Appearance: Normal appearance. She is not ill-appearing, toxic-appearing or diaphoretic.  Cardiovascular:     Pulses: Normal pulses.  Musculoskeletal:        General: Swelling (mild of left hand throughout) and tenderness (Left hand and fingers) present.     Comments: ROM of fingers 2-5 limited by about 75% due to pain. Thumbs is intact  Skin:    General: Skin is warm.     Capillary Refill: Capillary refill takes less than 2 seconds.     Findings: Erythema present. No bruising.     Comments: No skin breakdown  Neurological:     General: No focal deficit present.     Mental Status: She is alert.     Sensory: No sensory deficit.     Motor: No weakness.     UC Treatments / Results  Labs (all labs ordered are listed, but only abnormal results are displayed) Labs Reviewed - No data to display  EKG   Radiology No results found.  Procedures Procedures (including critical care time)  Medications Ordered in UC Medications  ibuprofen (ADVIL) tablet 800 mg (has no administration in time range)    Initial Impression / Assessment and Plan / UC Course  I have reviewed the triage vital signs and the nursing notes.  Pertinent labs & imaging results that were available during my care of the patient were reviewed by me and considered in my medical decision making (see chart for details).     New. Image pending. Patient to be given ibuprofen for pain.   UPDATE: No evidence of fracture. ACE wrap and RICE. Discussed red flag signs and symptoms. Motrin PRN as directed on the bottle. Occupational health follow up on Monday for further management.  Final Clinical Impressions(s) / UC Diagnoses   Final diagnoses:  None   Discharge Instructions   None    ED Prescriptions   None    PDMP not reviewed this encounter.   Hughie Closs, Hershal Coria 12/09/21 1915    Hughie Closs, PA-C 12/09/21 1928

## 2021-12-09 NOTE — ED Triage Notes (Signed)
Pt c/o right hand pain. 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 She states she got her hand caught in the bar of the wheel chair and brake.

## 2022-05-08 NOTE — Progress Notes (Unsigned)
Chief Complaint  Patient presents with   Establish Care    Needs referral for obgyn; would like to discuss weight loss medications; also needs a physical.    HPI: [name] is a 41 y.o. female, who is here today to establish care.  Former PCP: Dr. Kennieth Rad Last preventive routine visit: ***  Chronic medical problems: *** Reporting hx of vit D being low in the past, she would like to have it checked today.  Concerns today: *** She was on Ozempic. ***  Review of Systems  Constitutional:  Negative for appetite change, fatigue and fever.  HENT:  Negative for dental problem, hearing loss, mouth sores, sore throat, trouble swallowing and voice change.   Eyes:  Negative for redness and visual disturbance.  Respiratory:  Negative for cough, shortness of breath and wheezing.   Cardiovascular:  Negative for chest pain and leg swelling.  Gastrointestinal:  Negative for abdominal pain, nausea and vomiting.       No changes in bowel habits.  Endocrine: Negative for cold intolerance, heat intolerance, polydipsia, polyphagia and polyuria.  Genitourinary:  Negative for decreased urine volume, dysuria, hematuria, vaginal bleeding and vaginal discharge.  Musculoskeletal:  Negative for arthralgias, gait problem and myalgias.  Skin:  Negative for color change and rash.  Allergic/Immunologic: Negative for environmental allergies.  Neurological:  Negative for syncope, weakness and headaches.  Hematological:  Negative for adenopathy. Does not bruise/bleed easily.  Psychiatric/Behavioral:  Negative for confusion and sleep disturbance. The patient is not nervous/anxious.   All other systems reviewed and are negative.  Rest see pertinent positives and negatives per HPI.  No current outpatient medications on file prior to visit.   No current facility-administered medications on file prior to visit.    Past Medical History:  Diagnosis Date   Hypertension    No Known Allergies  Family  History  Problem Relation Age of Onset   Hypertension Mother    Thyroid disease Mother    Hyperlipidemia Father     Social History   Socioeconomic History   Marital status: Single    Spouse name: Not on file   Number of children: Not on file   Years of education: Not on file   Highest education level: Not on file  Occupational History   Not on file  Tobacco Use   Smoking status: Former   Smokeless tobacco: Never  Vaping Use   Vaping Use: Never used  Substance and Sexual Activity   Alcohol use: No   Drug use: Never   Sexual activity: Not on file  Other Topics Concern   Not on file  Social History Narrative   Not on file   Social Determinants of Health   Financial Resource Strain: Not on file  Food Insecurity: Not on file  Transportation Needs: Not on file  Physical Activity: Not on file  Stress: Not on file  Social Connections: Not on file    Vitals:   05/09/22 1022  BP: 126/80  Pulse: 99  SpO2: 99%    Body mass index is 40.29 kg/m.  Physical Exam Vitals and nursing note reviewed.  Constitutional:      General: She is not in acute distress.    Appearance: She is well-developed.  HENT:     Head: Normocephalic and atraumatic.     Right Ear: Hearing, tympanic membrane, ear canal and external ear normal.     Left Ear: Hearing, tympanic membrane, ear canal and external ear normal.  Mouth/Throat:     Mouth: Mucous membranes are moist.     Pharynx: Oropharynx is clear. Uvula midline.  Eyes:     Extraocular Movements: Extraocular movements intact.     Conjunctiva/sclera: Conjunctivae normal.     Pupils: Pupils are equal, round, and reactive to light.  Neck:     Thyroid: No thyromegaly.     Trachea: No tracheal deviation.  Cardiovascular:     Rate and Rhythm: Normal rate and regular rhythm.     Pulses:          Dorsalis pedis pulses are 2+ on the right side and 2+ on the left side.       Posterior tibial pulses are 2+ on the right side and 2+ on the  left side.     Heart sounds: No murmur heard. Pulmonary:     Effort: Pulmonary effort is normal. No respiratory distress.     Breath sounds: Normal breath sounds.  Abdominal:     Palpations: Abdomen is soft. There is no hepatomegaly or mass.     Tenderness: There is no abdominal tenderness.  Genitourinary:    Comments: Deferred to gyn. Musculoskeletal:     Comments: No major deformity or signs of synovitis appreciated.  Lymphadenopathy:     Cervical: No cervical adenopathy.     Upper Body:     Right upper body: No supraclavicular adenopathy.     Left upper body: No supraclavicular adenopathy.  Skin:    General: Skin is warm.     Findings: No erythema or rash.  Neurological:     General: No focal deficit present.     Mental Status: She is alert and oriented to person, place, and time.     Cranial Nerves: No cranial nerve deficit.     Coordination: Coordination normal.     Gait: Gait normal.     Deep Tendon Reflexes:     Reflex Scores:      Bicep reflexes are 2+ on the right side and 2+ on the left side.      Patellar reflexes are 2+ on the right side and 2+ on the left side. Psychiatric:     Comments: Well groomed, good eye contact.     ASSESSMENT AND PLAN:  There are no diagnoses linked to this encounter.  No follow-ups on file.  Betty G. [place], MD  [facility]. [place] office.

## 2022-05-09 ENCOUNTER — Ambulatory Visit (INDEPENDENT_AMBULATORY_CARE_PROVIDER_SITE_OTHER): Payer: BC Managed Care – PPO | Admitting: Family Medicine

## 2022-05-09 ENCOUNTER — Encounter: Payer: Self-pay | Admitting: Family Medicine

## 2022-05-09 VITALS — BP 126/80 | HR 99 | Resp 16 | Ht 68.0 in | Wt 265.0 lb

## 2022-05-09 DIAGNOSIS — E559 Vitamin D deficiency, unspecified: Secondary | ICD-10-CM

## 2022-05-09 DIAGNOSIS — D500 Iron deficiency anemia secondary to blood loss (chronic): Secondary | ICD-10-CM

## 2022-05-09 DIAGNOSIS — Z0000 Encounter for general adult medical examination without abnormal findings: Secondary | ICD-10-CM | POA: Diagnosis not present

## 2022-05-09 DIAGNOSIS — Z1329 Encounter for screening for other suspected endocrine disorder: Secondary | ICD-10-CM

## 2022-05-09 DIAGNOSIS — Z130 Encounter for screening for diseases of the blood and blood-forming organs and certain disorders involving the immune mechanism: Secondary | ICD-10-CM

## 2022-05-09 DIAGNOSIS — Z13220 Encounter for screening for lipoid disorders: Secondary | ICD-10-CM | POA: Diagnosis not present

## 2022-05-09 DIAGNOSIS — N946 Dysmenorrhea, unspecified: Secondary | ICD-10-CM | POA: Diagnosis not present

## 2022-05-09 DIAGNOSIS — Z13228 Encounter for screening for other metabolic disorders: Secondary | ICD-10-CM | POA: Diagnosis not present

## 2022-05-09 LAB — LIPID PANEL
Cholesterol: 131 mg/dL (ref 0–200)
HDL: 47.9 mg/dL (ref >39.00)
LDL Cholesterol: 69 mg/dL (ref 0–99)
NonHDL: 83.48
Total CHOL/HDL Ratio: 3
Triglycerides: 73 mg/dL (ref 0.0–149.0)
VLDL: 14.6 mg/dL (ref 0.0–40.0)

## 2022-05-09 LAB — CBC
HCT: 37.1 % (ref 36.0–46.0)
Hemoglobin: 12 g/dL (ref 12.0–15.0)
MCHC: 32.3 g/dL (ref 30.0–36.0)
MCV: 74.7 fl — ABNORMAL LOW (ref 78.0–100.0)
Platelets: 360 10*3/uL (ref 150.0–400.0)
RBC: 4.96 Mil/uL (ref 3.87–5.11)
RDW: 15.3 % (ref 11.5–15.5)
WBC: 6.8 10*3/uL (ref 4.0–10.5)

## 2022-05-09 LAB — COMPREHENSIVE METABOLIC PANEL
ALT: 15 U/L (ref 0–35)
AST: 12 U/L (ref 0–37)
Albumin: 4 g/dL (ref 3.5–5.2)
Alkaline Phosphatase: 91 U/L (ref 39–117)
BUN: 17 mg/dL (ref 6–23)
CO2: 28 mEq/L (ref 19–32)
Calcium: 9.3 mg/dL (ref 8.4–10.5)
Chloride: 104 mEq/L (ref 96–112)
Creatinine, Ser: 0.64 mg/dL (ref 0.40–1.20)
GFR: 112.01 mL/min (ref >60.00)
Glucose, Bld: 94 mg/dL (ref 70–99)
Potassium: 4.3 mEq/L (ref 3.5–5.1)
Sodium: 140 mEq/L (ref 135–145)
Total Bilirubin: 0.5 mg/dL (ref 0.2–1.2)
Total Protein: 7.7 g/dL (ref 6.0–8.3)

## 2022-05-09 LAB — HEMOGLOBIN A1C: Hgb A1c MFr Bld: 6 % (ref 4.6–6.5)

## 2022-05-09 LAB — IRON: Iron: 38 ug/dL — ABNORMAL LOW (ref 42–145)

## 2022-05-09 LAB — VITAMIN D 25 HYDROXY (VIT D DEFICIENCY, FRACTURES): VITD: 10.82 ng/mL — ABNORMAL LOW (ref 30.00–100.00)

## 2022-05-09 LAB — TSH: TSH: 1.6 u[IU]/mL (ref 0.35–5.50)

## 2022-05-09 LAB — FERRITIN: Ferritin: 16.3 ng/mL (ref 10.0–291.0)

## 2022-05-09 NOTE — Patient Instructions (Addendum)
A few things to remember from today's visit:  Routine general medical examination at a health care facility  Dysmenorrhea - Plan: Ambulatory referral to Gynecology  Vitamin D insufficiency - Plan: VITAMIN D 25 Hydroxy (Vit-D Deficiency, Fractures)  Iron deficiency anemia due to chronic blood loss - Plan: CBC, Iron, Ferritin, TSH  Obesity, morbid, BMI 40.0-49.9 (HCC) - Plan: Amb Ref to Medical Weight Management, TSH  Screening for lipoid disorders - Plan: Lipid panel  Screening for endocrine, metabolic and immunity disorder - Plan: Hemoglobin A1c, Comprehensive metabolic panel  Do not use My Chart to request refills or for acute issues that need immediate attention.   Please be sure medication list is accurate. If a new problem present, please set up appointment sooner than planned today.  Health Maintenance, Female Adopting a healthy lifestyle and getting preventive care are important in promoting health and wellness. Ask your health care provider about: The right schedule for you to have regular tests and exams. Things you can do on your own to prevent diseases and keep yourself healthy. What should I know about diet, weight, and exercise? Eat a healthy diet  Eat a diet that includes plenty of vegetables, fruits, low-fat dairy products, and lean protein. Do not eat a lot of foods that are high in solid fats, added sugars, or sodium. Maintain a healthy weight Body mass index (BMI) is used to identify weight problems. It estimates body fat based on height and weight. Your health care provider can help determine your BMI and help you achieve or maintain a healthy weight. Get regular exercise Get regular exercise. This is one of the most important things you can do for your health. Most adults should: Exercise for at least 150 minutes each week. The exercise should increase your heart rate and make you sweat (moderate-intensity exercise). Do strengthening exercises at least twice a  week. This is in addition to the moderate-intensity exercise. Spend less time sitting. Even light physical activity can be beneficial. Watch cholesterol and blood lipids Have your blood tested for lipids and cholesterol at 39 years of age, then have this test every 5 years. Have your cholesterol levels checked more often if: Your lipid or cholesterol levels are high. You are older than 39 years of age. You are at high risk for heart disease. What should I know about cancer screening? Depending on your health history and family history, you may need to have cancer screening at various ages. This may include screening for: Breast cancer. Cervical cancer. Colorectal cancer. Skin cancer. Lung cancer. What should I know about heart disease, diabetes, and high blood pressure? Blood pressure and heart disease High blood pressure causes heart disease and increases the risk of stroke. This is more likely to develop in people who have high blood pressure readings or are overweight. Have your blood pressure checked: Every 3-5 years if you are 42-93 years of age. Every year if you are 63 years old or older. Diabetes Have regular diabetes screenings. This checks your fasting blood sugar level. Have the screening done: Once every three years after age 6 if you are at a normal weight and have a low risk for diabetes. More often and at a younger age if you are overweight or have a high risk for diabetes. What should I know about preventing infection? Hepatitis B If you have a higher risk for hepatitis B, you should be screened for this virus. Talk with your health care provider to find out if you are  at risk for hepatitis B infection. Hepatitis C Testing is recommended for: Everyone born from [birth year] through 1963. Anyone with known risk factors for hepatitis C. Sexually transmitted infections (STIs) Get screened for STIs, including gonorrhea and chlamydia, if: You are sexually active and are younger  than 38 years of age. You are older than 39 years of age and your health care provider tells you that you are at risk for this type of infection. Your sexual activity has changed since you were last screened, and you are at increased risk for chlamydia or gonorrhea. Ask your health care provider if you are at risk. Ask your health care provider about whether you are at high risk for HIV. Your health care provider may recommend a prescription medicine to help prevent HIV infection. If you choose to take medicine to prevent HIV, you should first get tested for HIV. You should then be tested every 3 months for as long as you are taking the medicine. Pregnancy If you are about to stop having your period (premenopausal) and you may become pregnant, seek counseling before you get pregnant. Take 400 to 800 micrograms (mcg) of folic acid every day if you become pregnant. Ask for birth control (contraception) if you want to prevent pregnancy. Osteoporosis and menopause Osteoporosis is a disease in which the bones lose minerals and strength with aging. This can result in bone fractures. If you are 24 years old or older, or if you are at risk for osteoporosis and fractures, ask your health care provider if you should: Be screened for bone loss. Take a calcium or vitamin D supplement to lower your risk of fractures. Be given hormone replacement therapy (HRT) to treat symptoms of menopause. Follow these instructions at home: Alcohol use Do not drink alcohol if: Your health care provider tells you not to drink. You are pregnant, may be pregnant, or are planning to become pregnant. If you drink alcohol: Limit how much you have to: 0-1 drink a day. Know how much alcohol is in your drink. In the [place] one drink equals one 12 oz bottle of beer (355 mL), one 5 oz glass of wine (148 mL), or one 1 oz glass of hard liquor (44 mL). Lifestyle Do not use any products that contain nicotine or tobacco. These products  include cigarettes, chewing tobacco, and vaping devices, such as e-cigarettes. If you need help quitting, ask your health care provider. Do not use street drugs. Do not share needles. Ask your health care provider for help if you need support or information about quitting drugs. General instructions Schedule regular health, dental, and eye exams. Stay current with your vaccines. Tell your health care provider if: You often feel depressed. You have ever been abused or do not feel safe at home. Summary Adopting a healthy lifestyle and getting preventive care are important in promoting health and wellness. Follow your health care provider's instructions about healthy diet, exercising, and getting tested or screened for diseases. Follow your health care provider's instructions on monitoring your cholesterol and blood pressure. This information is not intended to replace advice given to you by your health care provider. Make sure you discuss any questions you have with your health care provider. Document Revised: 03/28/2021 Document Reviewed: 03/28/2021 Elsevier Patient Education  2023 [employer].

## 2022-05-10 ENCOUNTER — Encounter: Payer: Self-pay | Admitting: Family Medicine

## 2022-05-15 ENCOUNTER — Other Ambulatory Visit: Payer: Self-pay | Admitting: Family Medicine

## 2022-05-15 MED ORDER — VITAMIN D (ERGOCALCIFEROL) 1.25 MG (50000 UNIT) PO CAPS: 50000.0000 [IU] | ORAL_CAPSULE | ORAL | 0 refills | Status: DC

## 2022-05-16 ENCOUNTER — Other Ambulatory Visit (HOSPITAL_COMMUNITY)
Admission: RE | Admit: 2022-05-16 | Discharge: 2022-05-16 | Disposition: A | Discharge status: Home / Self Care | Payer: BC Managed Care – PPO | Source: Ambulatory Visit | Attending: Radiology | Admitting: Radiology

## 2022-05-16 ENCOUNTER — Ambulatory Visit: Payer: BC Managed Care – PPO | Admitting: Radiology

## 2022-05-16 ENCOUNTER — Encounter: Payer: Self-pay | Admitting: Radiology

## 2022-05-16 VITALS — BP 124/90 | Ht 66.0 in | Wt 265.0 lb

## 2022-05-16 DIAGNOSIS — N920 Excessive and frequent menstruation with regular cycle: Secondary | ICD-10-CM

## 2022-05-16 DIAGNOSIS — N762 Acute vulvitis: Secondary | ICD-10-CM

## 2022-05-16 DIAGNOSIS — Z01419 Encounter for gynecological examination (general) (routine) without abnormal findings: Secondary | ICD-10-CM | POA: Insufficient documentation

## 2022-05-16 MED ORDER — CLOBETASOL PROPIONATE 0.05 % EX OINT: 1.0000 | TOPICAL_OINTMENT | Freq: Two times a day (BID) | CUTANEOUS | 0 refills | Status: DC

## 2022-05-17 ENCOUNTER — Encounter: Payer: Self-pay | Admitting: Family Medicine

## 2022-05-17 DIAGNOSIS — N920 Excessive and frequent menstruation with regular cycle: Secondary | ICD-10-CM

## 2022-05-17 NOTE — Telephone Encounter (Signed)
OK for [place] imaging. Schedule appt with me the same week, please. Thanks!

## 2022-05-17 NOTE — Telephone Encounter (Signed)
Per Debarah Crape " Patient wants something sooner elsewhere our first available is August 3. "  Okay to schedule at [facility] imaging? Does she need office visit with you to discuss ultrasound?

## 2022-05-17 NOTE — Telephone Encounter (Signed)
I will forward this message to appointments to reach out to and schedule ultrasound.   I just wanted you see her nice message.

## 2022-05-18 LAB — CYTOLOGY - PAP
Adequacy: ABSENT
Comment: NEGATIVE
Diagnosis: NEGATIVE
High risk HPV: NEGATIVE

## 2022-05-24 ENCOUNTER — Other Ambulatory Visit: Payer: PRIVATE HEALTH INSURANCE

## 2022-05-25 ENCOUNTER — Other Ambulatory Visit: Payer: PRIVATE HEALTH INSURANCE

## 2022-06-01 ENCOUNTER — Other Ambulatory Visit: Payer: Self-pay | Admitting: Radiology

## 2022-06-01 DIAGNOSIS — N762 Acute vulvitis: Secondary | ICD-10-CM

## 2022-06-01 NOTE — Telephone Encounter (Signed)
Rx was sent on 05/16/22 for 30 gram tube.

## 2022-06-13 IMAGING — CR DG HAND COMPLETE 3+V*R*
3 series · 3 of 3 positions shown · non-contrast
Comparison: None.

CLINICAL DATA: Right hand pain and swelling

EXAM:
RIGHT HAND - COMPLETE 3+ VIEW

[hand ap]
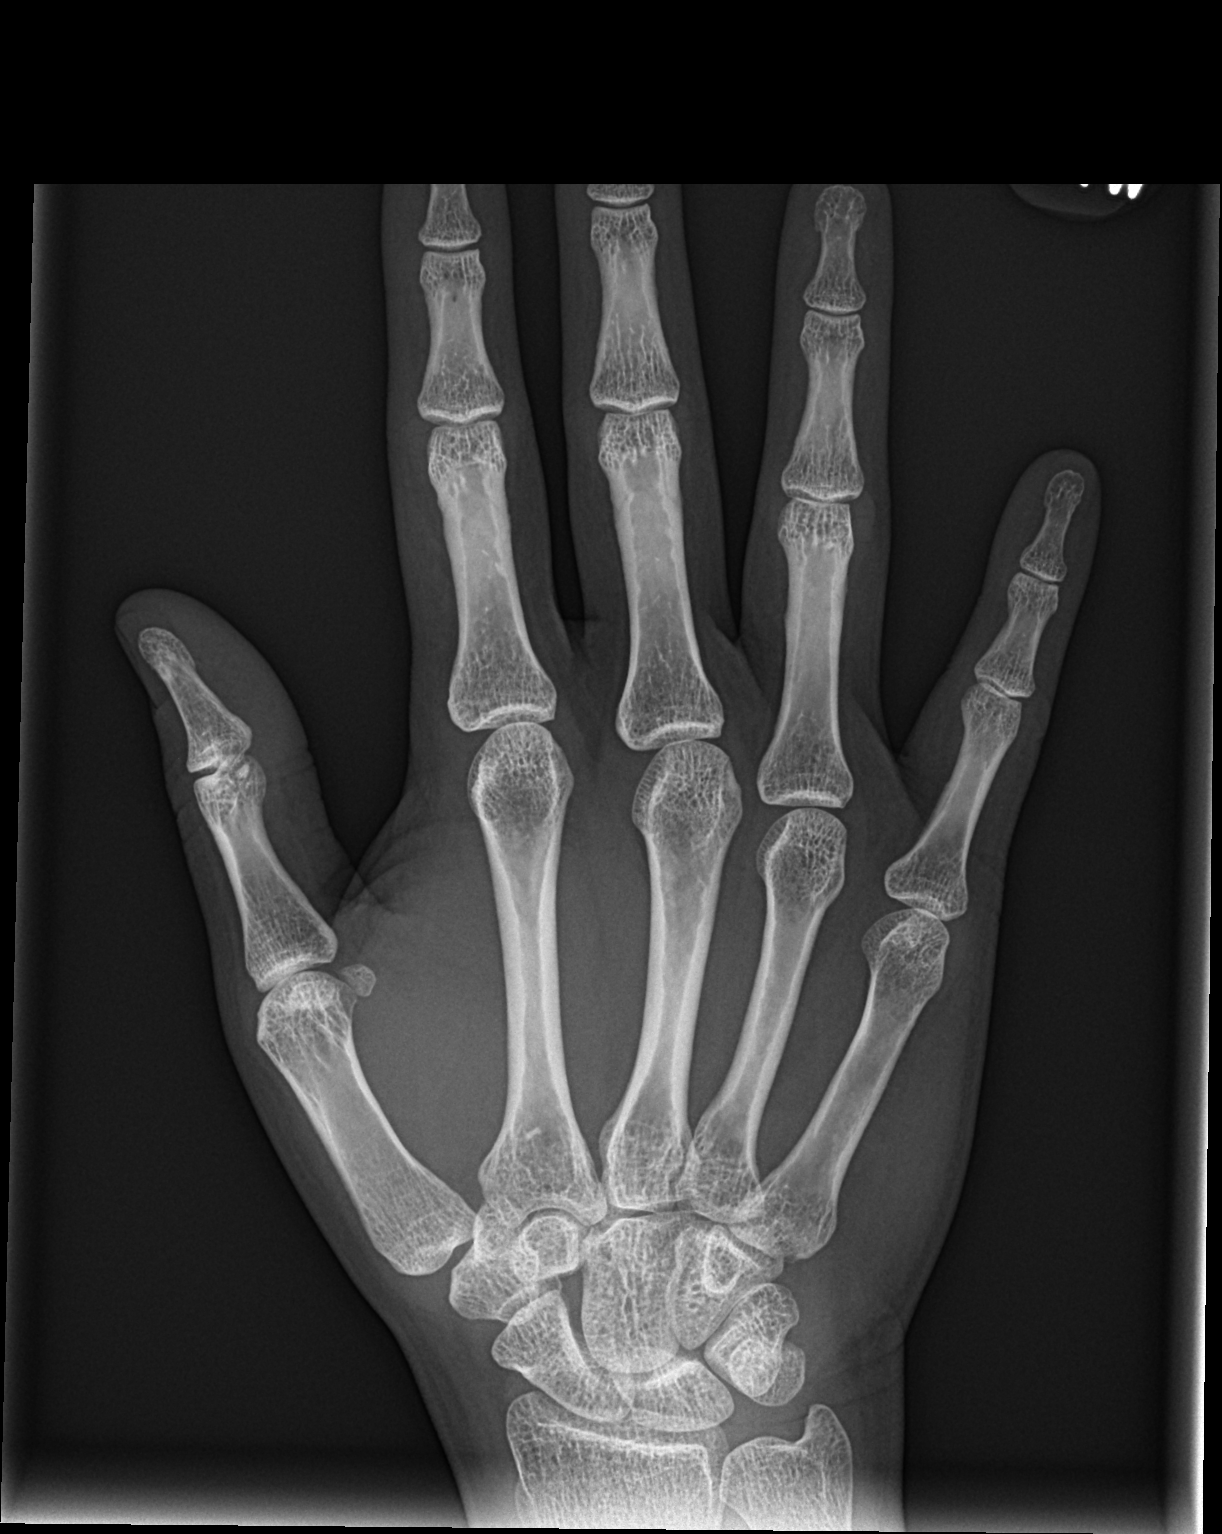

[hand obl]
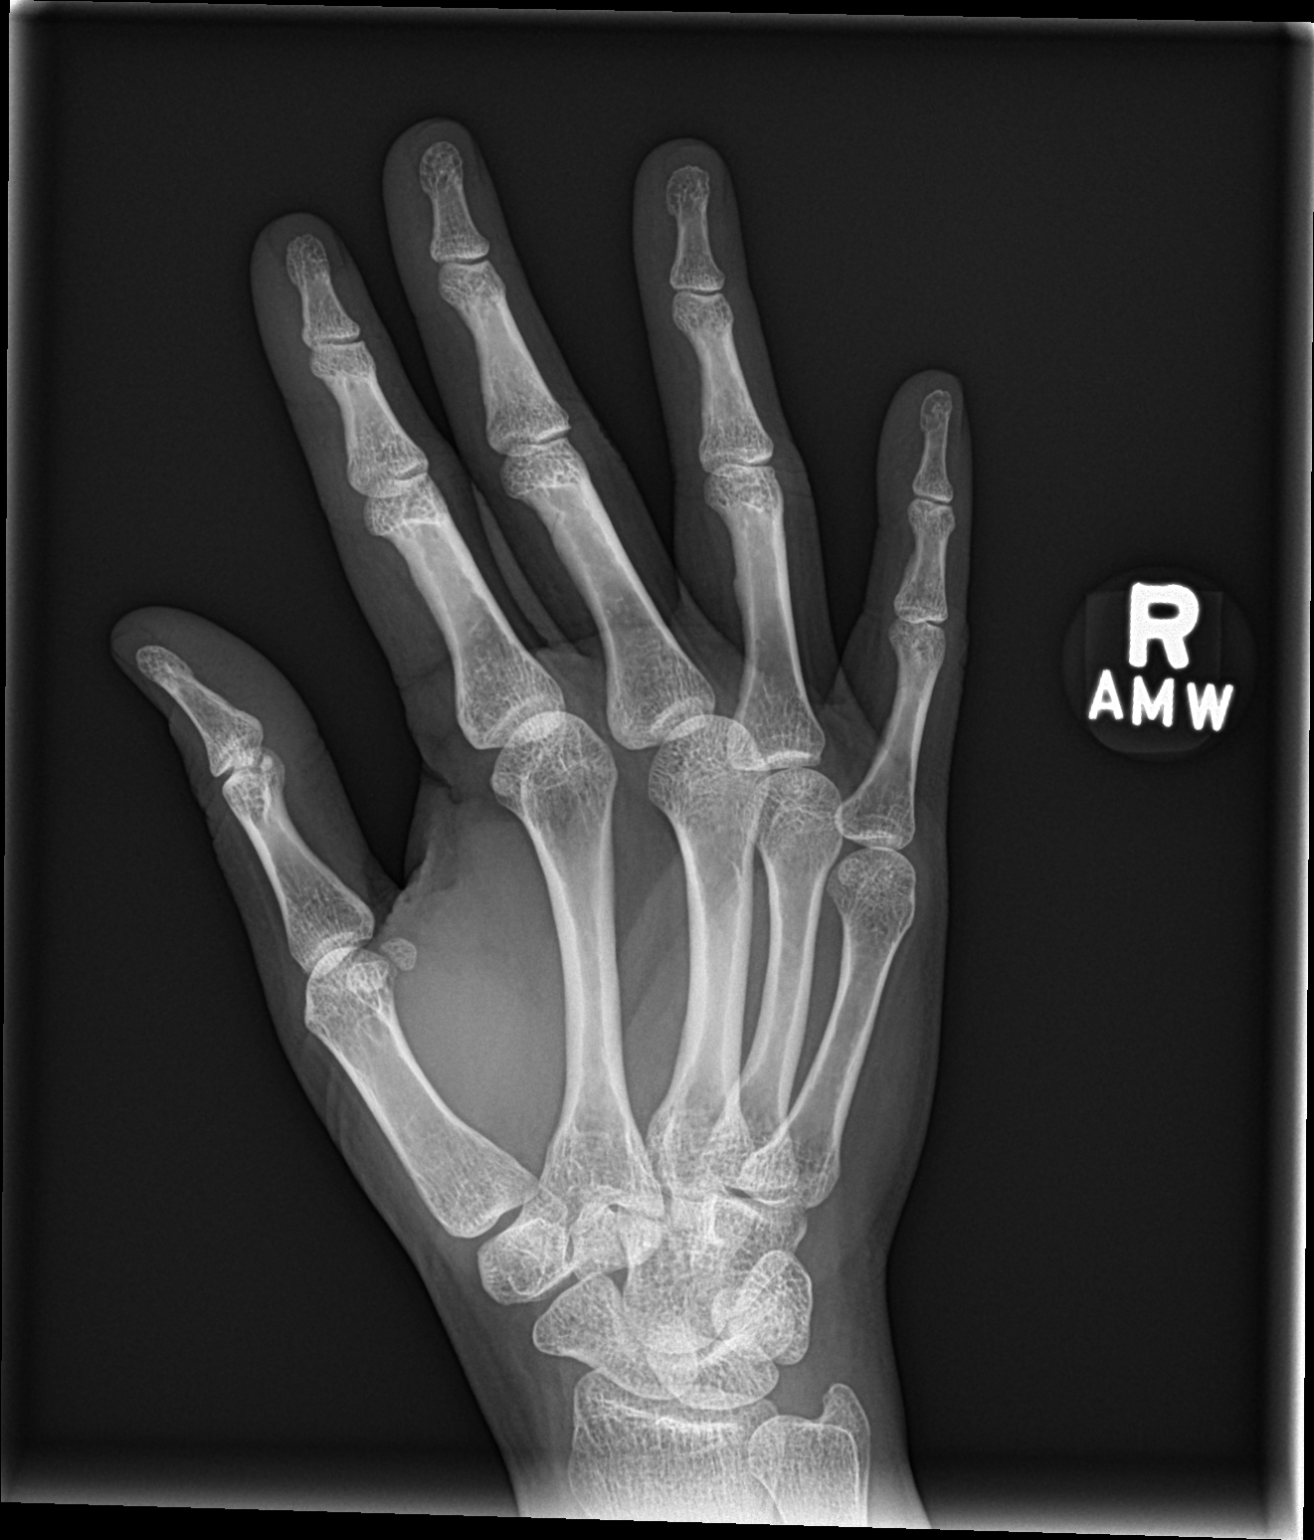

[hand lat]
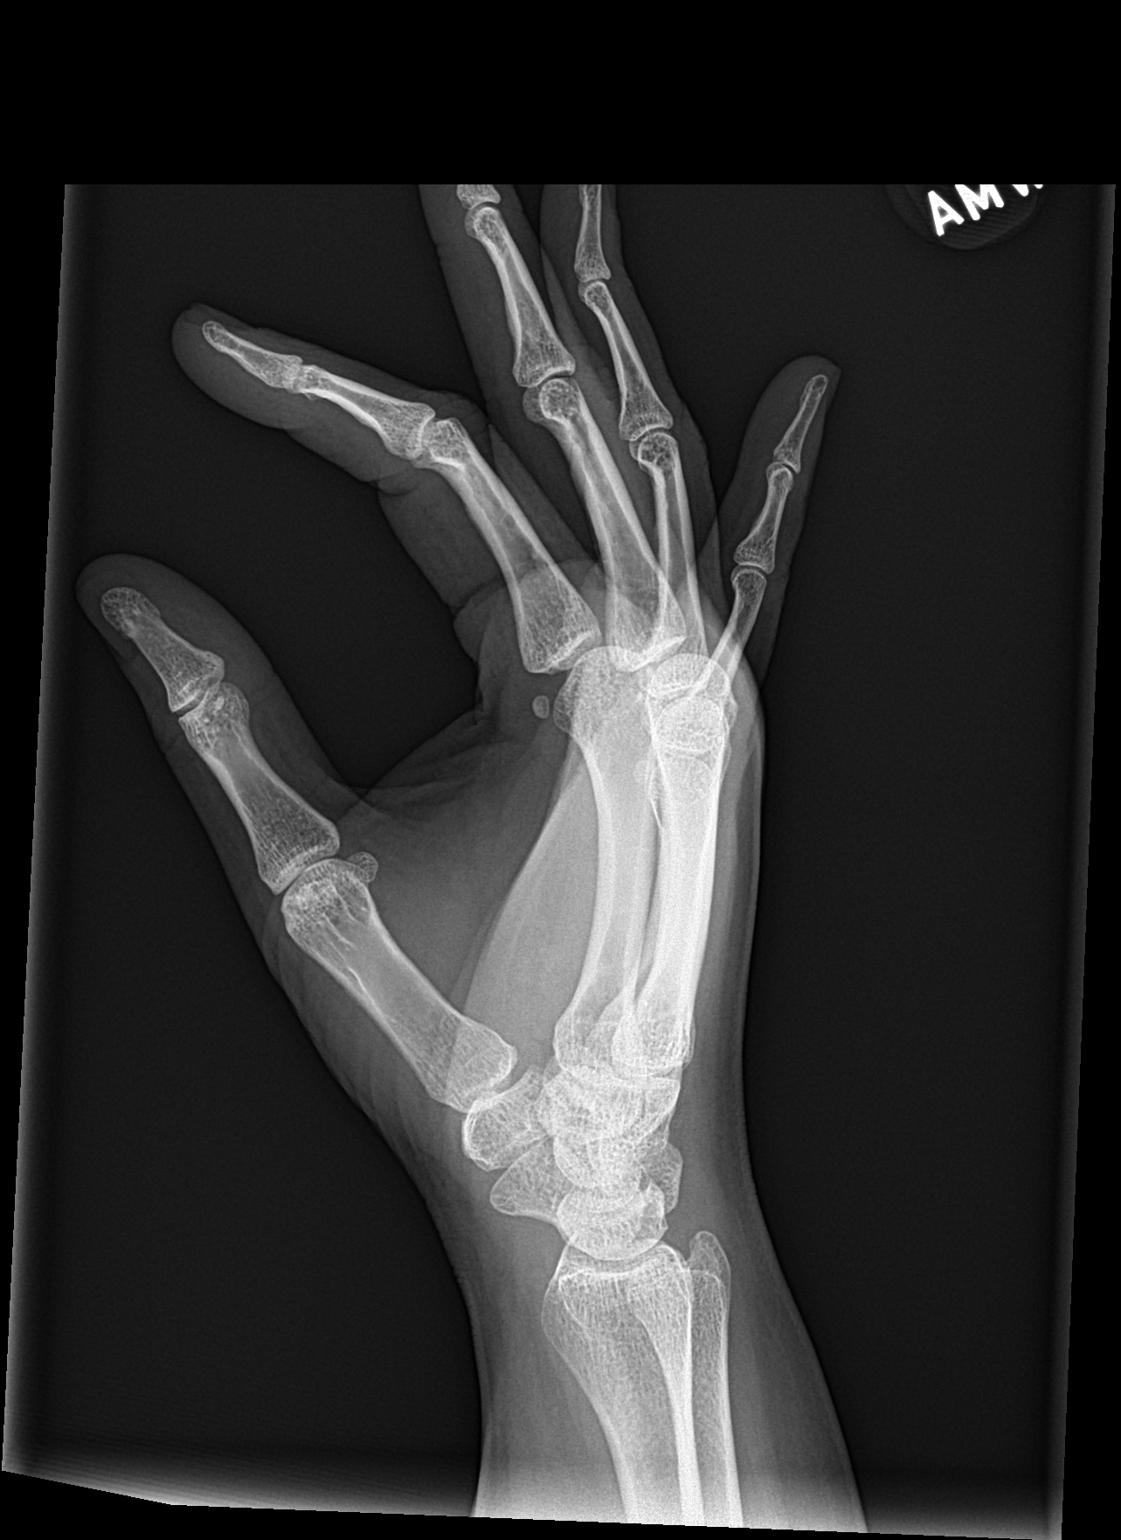

[3 of 3 positions shown; findings below may reference images not displayed]

FINDINGS: There is no evidence of fracture or dislocation. There is no
evidence of arthropathy or other focal bone abnormality. Soft
tissues are unremarkable.
IMPRESSION: Negative.

## 2022-06-21 ENCOUNTER — Encounter (HOSPITAL_COMMUNITY): Payer: Self-pay

## 2022-06-21 ENCOUNTER — Ambulatory Visit (HOSPITAL_COMMUNITY)
Admission: EM | Admit: 2022-06-21 | Discharge: 2022-06-21 | Disposition: A | Discharge status: Home / Self Care | Payer: BC Managed Care – PPO | Attending: Sports Medicine | Admitting: Sports Medicine

## 2022-06-21 DIAGNOSIS — Z20822 Contact with and (suspected) exposure to covid-19: Secondary | ICD-10-CM | POA: Insufficient documentation

## 2022-06-21 DIAGNOSIS — J069 Acute upper respiratory infection, unspecified: Secondary | ICD-10-CM | POA: Insufficient documentation

## 2022-06-21 LAB — POC INFLUENZA A AND B ANTIGEN (URGENT CARE ONLY)
INFLUENZA A ANTIGEN, POC: NEGATIVE
INFLUENZA B ANTIGEN, POC: NEGATIVE

## 2022-06-21 NOTE — ED Notes (Signed)
No answer in waiting area x2 

## 2022-06-21 NOTE — ED Triage Notes (Signed)
Pt c/o cough, sore throat, chills, body aches, nasal congestion, and loss of appetite since Monday. Took zyrtec with no relief.

## 2022-06-21 NOTE — ED Provider Notes (Addendum)
[facility]   [id number] 06/21/22 Arrival Time: 1103  ASSESSMENT & PLAN:  1. Viral URI   Discussed the patient that she likely has a viral URI that she caught from her daughter.  Her flu test today was negative.  Her COVID test is pending.  We discussed in general supportive care including over-the-counter medicine such as Mucinex, Tylenol, and throat lozenges for symptomatic relief.  I encouraged vigorous p.o. hydration and handwashing.  Patient voiced understanding and agree with plan.  All questions answered.  Work note provided.   No orders of the defined types were placed in this encounter.    Discharge Instructions      You likely have a viral illness We will call you if COVID test is positive Take OTC meds like mucinex to help with congestion, Tylenol for aches Drink lots of water Wash your hands Follow up with PCP if symptoms worsen     Follow-up Information     [place], Betty G, MD.   Specialty: Family Medicine Why: If symptoms worsen Contact information: [address] Christena Flake [facility] [place] [address] [phone]                  Reviewed expectations re: course of current medical issues. Questions answered. Outlined signs and symptoms indicating need for more acute intervention. Patient verbalized understanding. After Visit Summary given.   SUBJECTIVE: Patient was urgent care to be evaluated for 3 days of nasal congestion, cough, sore throat, chills.  Her symptoms began on Monday.  Cough is nonproductive. Nasal congestion is clear. She has been taking Zyrtec, and Tylenol and not gotten much better.  She has decreased appetite.  She denies any fever.  She does have a known sick contact of her daughter who is in daycare.  She has her daughter came home with similar symptoms last week.  She did not take her daughter to a pediatrician to be evaluated, but the daughter is already feeling better.  Patient denies fever, chest pain, abdominal  pain.  Patient's last menstrual period was 06/08/2022. Past Surgical History:  Procedure Laterality Date   HYSTEROSCOPY WITH D & C       OBJECTIVE:  Vitals:   06/21/22 1220  BP: 127/72  Pulse: (!) 108  Resp: 18  Temp: 98.1 F (36.7 C)  TempSrc: Oral  SpO2: 97%     Physical Exam Vitals reviewed.  Constitutional:      Appearance: Normal appearance.  HENT:     Head: Normocephalic.     Nose: Congestion present.     Mouth/Throat:     Mouth: Mucous membranes are moist.     Pharynx: Oropharynx is clear. Posterior oropharyngeal erythema present. No oropharyngeal exudate.  Eyes:     Conjunctiva/sclera: Conjunctivae normal.  Cardiovascular:     Rate and Rhythm: Normal rate and regular rhythm.     Heart sounds: Normal heart sounds.  Pulmonary:     Effort: Pulmonary effort is normal. No respiratory distress.     Breath sounds: Normal breath sounds.  Musculoskeletal:     Cervical back: Neck supple. No tenderness.  Lymphadenopathy:     Cervical: No cervical adenopathy.  Skin:    General: Skin is warm.  Neurological:     General: No focal deficit present.     Mental Status: She is alert.  Psychiatric:        Mood and Affect: Mood normal.      Labs: Results for orders placed or performed during the hospital encounter  of 06/21/22  POC Influenza A & B Ag (Urgent Care)  Result Value Ref Range   INFLUENZA A ANTIGEN, POC NEGATIVE NEGATIVE   INFLUENZA B ANTIGEN, POC NEGATIVE NEGATIVE   Labs Reviewed  SARS CORONAVIRUS 2 (TAT 6-24 HRS)  POC INFLUENZA A AND B ANTIGEN (URGENT CARE ONLY)    Imaging: No results found.   No Known Allergies                                             Past Medical History:  Diagnosis Date   Anemia    Hypertension     Social History   Socioeconomic History   Marital status: Single    Spouse name: Not on file   Number of children: Not on file   Years of education: Not on file   Highest education level: Not on file  Occupational  History   Not on file  Tobacco Use   Smoking status: Former   Smokeless tobacco: Never  Vaping Use   Vaping Use: Never used  Substance and Sexual Activity   Alcohol use: No   Drug use: Never   Sexual activity: Not Currently    Partners: Male    Birth control/protection: None  Other Topics Concern   Not on file  Social History Narrative   Not on file   Social Determinants of Health   Financial Resource Strain: Not on file  Food Insecurity: Not on file  Transportation Needs: Not on file  Physical Activity: Not on file  Stress: Not on file  Social Connections: Not on file  Intimate Partner Violence: Not on file    Family History  Problem Relation Age of Onset   Hypertension Mother    Thyroid disease Mother    Ovarian cysts Mother        benign ovarian cyst--hx oophorectomy   Diabetes Father    Hyperlipidemia Father       Britten Seyfried, Baldemar Friday, MD 06/21/22 1334    Alayzha An, Baldemar Friday, MD 06/21/22 1342

## 2022-06-21 NOTE — Discharge Instructions (Addendum)
You likely have a viral illness We will call you if COVID test is positive Take OTC meds like mucinex to help with congestion, Tylenol for aches Drink lots of water Wash your hands Follow up with PCP if symptoms worsen

## 2022-06-22 LAB — SARS CORONAVIRUS 2 (TAT 6-24 HRS): SARS Coronavirus 2: NEGATIVE

## 2022-06-23 ENCOUNTER — Telehealth: Payer: BC Managed Care – PPO | Admitting: Physician Assistant

## 2022-06-23 ENCOUNTER — Ambulatory Visit (HOSPITAL_COMMUNITY): Payer: Self-pay

## 2022-06-23 DIAGNOSIS — J3489 Other specified disorders of nose and nasal sinuses: Secondary | ICD-10-CM | POA: Diagnosis not present

## 2022-06-23 DIAGNOSIS — J019 Acute sinusitis, unspecified: Secondary | ICD-10-CM | POA: Diagnosis not present

## 2022-06-23 DIAGNOSIS — B9689 Other specified bacterial agents as the cause of diseases classified elsewhere: Secondary | ICD-10-CM

## 2022-06-23 MED ORDER — MUPIROCIN 2 % EX OINT: 1.0000 | TOPICAL_OINTMENT | Freq: Two times a day (BID) | CUTANEOUS | 0 refills | Status: DC

## 2022-06-23 MED ORDER — IPRATROPIUM BROMIDE 0.03 % NA SOLN: 2.0000 | Freq: Two times a day (BID) | NASAL | 0 refills | Status: DC

## 2022-06-23 MED ORDER — AMOXICILLIN-POT CLAVULANATE 875-125 MG PO TABS: 1.0000 | ORAL_TABLET | Freq: Two times a day (BID) | ORAL | 0 refills | Status: DC

## 2022-06-23 NOTE — Patient Instructions (Signed)
[name], thank you for joining [name], PA-C for today's virtual visit.  While this provider is not your primary care provider (PCP), if your PCP is located in our provider database this encounter information will be shared with them immediately following your visit.  Consent: (Patient) Kathryn Williams provided verbal consent for this virtual visit at the beginning of the encounter.  Current Medications:  Current Outpatient Medications:    amoxicillin-clavulanate (AUGMENTIN) 875-125 MG tablet, Take 1 tablet by mouth 2 (two) times daily., Disp: 14 tablet, Rfl: 0   clobetasol ointment (TEMOVATE) 0.05 %, APPLY TO AFFECTED AREA TWICE A DAY, Disp: 30 g, Rfl: 0   ipratropium (ATROVENT) 0.03 % nasal spray, Place 2 sprays into both nostrils every 12 (twelve) hours., Disp: 30 mL, Rfl: 0   mupirocin ointment (BACTROBAN) 2 %, Apply 1 Application topically 2 (two) times daily., Disp: 22 g, Rfl: 0   OZEMPIC, 1 MG/DOSE, 4 MG/3ML SOPN, Inject into the skin., Disp: , Rfl:    Vitamin D, Ergocalciferol, (DRISDOL) 1.25 MG (50000 UNIT) CAPS capsule, Take 1 capsule (50,000 Units total) by mouth every 7 (seven) days for 8 doses., Disp: 8 capsule, Rfl: 0   Medications ordered in this encounter:  Meds ordered this encounter  Medications   amoxicillin-clavulanate (AUGMENTIN) 875-125 MG tablet    Sig: Take 1 tablet by mouth 2 (two) times daily.    Dispense:  14 tablet    Refill:  0    Order Specific Question:   Supervising Provider    Answer:   Hyacinth Meeker, BRIAN [[id number]]   mupirocin ointment (BACTROBAN) 2 %    Sig: Apply 1 Application topically 2 (two) times daily.    Dispense:  22 g    Refill:  0    Order Specific Question:   Supervising Provider    Answer:   MILLER, BRIAN [[id number]]   ipratropium (ATROVENT) 0.03 % nasal spray    Sig: Place 2 sprays into both nostrils every 12 (twelve) hours.    Dispense:  30 mL    Refill:  0    Order Specific Question:   Supervising Provider    Answer:   Hyacinth Meeker, BRIAN  [[id number]]     *If you need refills on other medications prior to your next appointment, please contact your pharmacy*  Follow-Up: Call back or seek an in-person evaluation if the symptoms worsen or if the condition fails to improve as anticipated.  Other Instructions Sinus Infection, Adult A sinus infection, also called sinusitis, is inflammation of your sinuses. Sinuses are hollow spaces in the bones around your face. Your sinuses are located: Around your eyes. In the middle of your forehead. Behind your nose. In your cheekbones. Mucus normally drains out of your sinuses. When your nasal tissues become inflamed or swollen, mucus can become trapped or blocked. This allows bacteria, viruses, and fungi to grow, which leads to infection. Most infections of the sinuses are caused by a virus. A sinus infection can develop quickly. It can last for up to 4 weeks (acute) or for more than 12 weeks (chronic). A sinus infection often develops after a cold. What are the causes? This condition is caused by anything that creates swelling in the sinuses or stops mucus from draining. This includes: Allergies. Asthma. Infection from bacteria or viruses. Deformities or blockages in your nose or sinuses. Abnormal growths in the nose (nasal polyps). Pollutants, such as chemicals or irritants in the air. Infection from fungi. This is rare. What increases the risk?  You are more likely to develop this condition if you: Have a weak body defense system (immune system). Do a lot of swimming or diving. Overuse nasal sprays. Smoke. What are the signs or symptoms? The main symptoms of this condition are pain and a feeling of pressure around the affected sinuses. Other symptoms include: Stuffy nose or congestion that makes it difficult to breathe through your nose. Thick yellow or greenish drainage from your nose. Tenderness, swelling, and warmth over the affected sinuses. A cough that may get worse at  night. Decreased sense of smell and taste. Extra mucus that collects in the throat or the back of the nose (postnasal drip) causing a sore throat or bad breath. Tiredness (fatigue). Fever. How is this diagnosed? This condition is diagnosed based on: Your symptoms. Your medical history. A physical exam. Tests to find out if your condition is acute or chronic. This may include: Checking your nose for nasal polyps. Viewing your sinuses using a device that has a light (endoscope). Testing for allergies or bacteria. Imaging tests, such as an MRI or CT scan. In rare cases, a bone biopsy may be done to rule out more serious types of fungal sinus disease. How is this treated? Treatment for a sinus infection depends on the cause and whether your condition is chronic or acute. If caused by a virus, your symptoms should go away on their own within 10 days. You may be given medicines to relieve symptoms. They include: Medicines that shrink swollen nasal passages (decongestants). A spray that eases inflammation of the nostrils (topical intranasal corticosteroids). Rinses that help get rid of thick mucus in your nose (nasal saline washes). Medicines that treat allergies (antihistamines). Over-the-counter pain relievers. If caused by bacteria, your health care provider may recommend waiting to see if your symptoms improve. Most bacterial infections will get better without antibiotic medicine. You may be given antibiotics if you have: A severe infection. A weak immune system. If caused by narrow nasal passages or nasal polyps, surgery may be needed. Follow these instructions at home: Medicines Take, use, or apply over-the-counter and prescription medicines only as told by your health care provider. These may include nasal sprays. If you were prescribed an antibiotic medicine, take it as told by your health care provider. Do not stop taking the antibiotic even if you start to feel better. Hydrate and  humidify  Drink enough fluid to keep your urine pale yellow. Staying hydrated will help to thin your mucus. Use a cool mist humidifier to keep the humidity level in your home above 50%. Inhale steam for 10-15 minutes, 3-4 times a day, or as told by your health care provider. You can do this in the bathroom while a hot shower is running. Limit your exposure to cool or dry air. Rest Rest as much as possible. Sleep with your head raised (elevated). Make sure you get enough sleep each night. General instructions  Apply a warm, moist washcloth to your face 3-4 times a day or as told by your health care provider. This will help with discomfort. Use nasal saline washes as often as told by your health care provider. Wash your hands often with soap and water to reduce your exposure to germs. If soap and water are not available, use hand sanitizer. Do not smoke. Avoid being around people who are smoking (secondhand smoke). Keep all follow-up visits. This is important. Contact a health care provider if: You have a fever. Your symptoms get worse. Your symptoms do not  improve within 10 days. Get help right away if: You have a severe headache. You have persistent vomiting. You have severe pain or swelling around your face or eyes. You have vision problems. You develop confusion. Your neck is stiff. You have trouble breathing. These symptoms may be an emergency. Get help right away. Call 911. Do not wait to see if the symptoms will go away. Do not drive yourself to the hospital. Summary A sinus infection is soreness and inflammation of your sinuses. Sinuses are hollow spaces in the bones around your face. This condition is caused by nasal tissues that become inflamed or swollen. The swelling traps or blocks the flow of mucus. This allows bacteria, viruses, and fungi to grow, which leads to infection. If you were prescribed an antibiotic medicine, take it as told by your health care provider. Do  not stop taking the antibiotic even if you start to feel better. Keep all follow-up visits. This is important. This information is not intended to replace advice given to you by your health care provider. Make sure you discuss any questions you have with your health care provider. Document Revised: 10/11/2021 Document Reviewed: 10/11/2021 Elsevier Patient Education  2023 Elsevier Inc.    If you have been instructed to have an in-person evaluation today at a local Urgent Care facility, please use the link below. It will take you to a list of all of our available [facility], including address, phone number and hours of operation. Please do not delay care.  [facility]  If you or a family member do not have a primary care provider, use the link below to schedule a visit and establish care. When you choose a [facility] primary care physician or advanced practice provider, you gain a long-term partner in health. Find a Primary Care Provider  Learn more about [facility]'s in-office and virtual care options: [facility] - Get Care Now

## 2022-06-23 NOTE — Progress Notes (Signed)
Virtual Visit Consent   Kathryn Williams, you are scheduled for a virtual visit with a [facility] provider today. Just as with appointments in the office, your consent must be obtained to participate. Your consent will be active for this visit and any virtual visit you may have with one of our providers in the next 365 days. If you have a MyChart account, a copy of this consent can be sent to you electronically.  As this is a virtual visit, video technology does not allow for your provider to perform a traditional examination. This may limit your provider's ability to fully assess your condition. If your provider identifies any concerns that need to be evaluated in person or the need to arrange testing (such as labs, EKG, etc.), we will make arrangements to do so. Although advances in technology are sophisticated, we cannot ensure that it will always work on either your end or our end. If the connection with a video visit is poor, the visit may have to be switched to a telephone visit. With either a video or telephone visit, we are not always able to ensure that we have a secure connection.  By engaging in this virtual visit, you consent to the provision of healthcare and authorize for your insurance to be billed (if applicable) for the services provided during this visit. Depending on your insurance coverage, you may receive a charge related to this service.  I need to obtain your verbal consent now. Are you willing to proceed with your visit today? Kathryn Williams has provided verbal consent on 06/23/2022 for a virtual visit (video or telephone). [name], PA-C  Date: 06/23/2022 4:57 PM  Virtual Visit via Video Note   I, [name], connected with  Kathryn Williams  ([id number], January 25, 1981) on 06/23/22 at  5:00 PM EDT by a video-enabled telemedicine application and verified that I am speaking with the correct person using two identifiers.  Location: Patient: Virtual Visit Location Patient:  Home Provider: Virtual Visit Location Provider: Home Office   I discussed the limitations of evaluation and management by telemedicine and the availability of in person appointments. The patient expressed understanding and agreed to proceed.    History of Present Illness: Kathryn Williams is a 41 y.o. who identifies as a female who was assigned female at birth, and is being seen today for possible sinus infection.  HPI: Sinusitis This is a new problem. The current episode started in the past 7 days (Monday; seen at [facility] on wednesday). The problem has been gradually worsening since onset. There has been no fever. The pain is moderate. Associated symptoms include congestion, headaches, sinus pressure, a sore throat and swollen glands. Pertinent negatives include no ear pain or hoarse voice. (Discolored drainage, myalgias) Past treatments include acetaminophen, oral decongestants and nasal decongestants (afrin). The treatment provided no relief.     Problems:  Patient Active Problem List   Diagnosis Date Noted   Vitamin D insufficiency 05/09/2022    Allergies: No Known Allergies Medications:  Current Outpatient Medications:    amoxicillin-clavulanate (AUGMENTIN) 875-125 MG tablet, Take 1 tablet by mouth 2 (two) times daily., Disp: 14 tablet, Rfl: 0   clobetasol ointment (TEMOVATE) 0.05 %, APPLY TO AFFECTED AREA TWICE A DAY, Disp: 30 g, Rfl: 0   ipratropium (ATROVENT) 0.03 % nasal spray, Place 2 sprays into both nostrils every 12 (twelve) hours., Disp: 30 mL, Rfl: 0   mupirocin ointment (BACTROBAN) 2 %, Apply 1 Application topically 2 (two) times daily., Disp:  22 g, Rfl: 0   OZEMPIC, 1 MG/DOSE, 4 MG/3ML SOPN, Inject into the skin., Disp: , Rfl:    Vitamin D, Ergocalciferol, (DRISDOL) 1.25 MG (50000 UNIT) CAPS capsule, Take 1 capsule (50,000 Units total) by mouth every 7 (seven) days for 8 doses., Disp: 8 capsule, Rfl: 0  Observations/Objective: Patient is well-developed, well-nourished in no acute  distress.  Resting comfortably at home.  Head is normocephalic, atraumatic.  No labored breathing.  Speech is clear and coherent with logical content.  Patient is alert and oriented at baseline.    Assessment and Plan: 1. Acute bacterial sinusitis - amoxicillin-clavulanate (AUGMENTIN) 875-125 MG tablet; Take 1 tablet by mouth 2 (two) times daily.  Dispense: 14 tablet; Refill: 0 - mupirocin ointment (BACTROBAN) 2 %; Apply 1 Application topically 2 (two) times daily.  Dispense: 22 g; Refill: 0 - ipratropium (ATROVENT) 0.03 % nasal spray; Place 2 sprays into both nostrils every 12 (twelve) hours.  Dispense: 30 mL; Refill: 0  2. Nasal sore  - Worsening symptoms that have not responded to OTC medications.  - Will give Augmentin, mupirocin for nasal wound, and Ipratropium bromide for nasal congestion and drainage - Continue allergy medications.  - Steam and humidifier can help - Stay well hydrated and get plenty of rest.  - Seek in person evaluation if no symptom improvement or if symptoms worsen   Follow Up Instructions: I discussed the assessment and treatment plan with the patient. The patient was provided an opportunity to ask questions and all were answered. The patient agreed with the plan and demonstrated an understanding of the instructions.  A copy of instructions were sent to the patient via MyChart unless otherwise noted below.    The patient was advised to call back or seek an in-person evaluation if the symptoms worsen or if the condition fails to improve as anticipated.  Time:  I spent 13 minutes with the patient via telehealth technology discussing the above problems/concerns.    [name], PA-C

## 2022-06-27 ENCOUNTER — Other Ambulatory Visit: Payer: BC Managed Care – PPO

## 2022-06-27 ENCOUNTER — Other Ambulatory Visit: Payer: BC Managed Care – PPO | Admitting: Radiology

## 2022-07-05 ENCOUNTER — Other Ambulatory Visit: Payer: Self-pay | Admitting: Family Medicine

## 2022-07-22 ENCOUNTER — Ambulatory Visit (HOSPITAL_COMMUNITY): Payer: Self-pay

## 2022-07-22 ENCOUNTER — Telehealth: Payer: BC Managed Care – PPO | Admitting: Physician Assistant

## 2022-07-22 ENCOUNTER — Encounter: Payer: Self-pay | Admitting: Physician Assistant

## 2022-07-22 DIAGNOSIS — R051 Acute cough: Secondary | ICD-10-CM

## 2022-07-22 MED ORDER — BENZONATATE 100 MG PO CAPS: 100.0000 mg | ORAL_CAPSULE | Freq: Two times a day (BID) | ORAL | 0 refills | Status: AC | PRN

## 2022-07-22 MED ORDER — COVID-19 AT HOME ANTIGEN TEST VI KIT: PACK | 0 refills | Status: AC

## 2022-07-22 NOTE — Progress Notes (Signed)
Virtual Visit Consent   Kathryn Williams, you are scheduled for a virtual visit with a [facility] provider today. Just as with appointments in the office, your consent must be obtained to participate. Your consent will be active for this visit and any virtual visit you may have with one of our providers in the next 365 days. If you have a MyChart account, a copy of this consent can be sent to you electronically.  As this is a virtual visit, video technology does not allow for your provider to perform a traditional examination. This may limit your provider's ability to fully assess your condition. If your provider identifies any concerns that need to be evaluated in person or the need to arrange testing (such as labs, EKG, etc.), we will make arrangements to do so. Although advances in technology are sophisticated, we cannot ensure that it will always work on either your end or our end. If the connection with a video visit is poor, the visit may have to be switched to a telephone visit. With either a video or telephone visit, we are not always able to ensure that we have a secure connection.  By engaging in this virtual visit, you consent to the provision of healthcare and authorize for your insurance to be billed (if applicable) for the services provided during this visit. Depending on your insurance coverage, you may receive a charge related to this service.  I need to obtain your verbal consent now. Are you willing to proceed with your visit today? Kathryn Williams has provided verbal consent on 07/22/2022 for a virtual visit (video or telephone). Inda Coke, [place]  Date: 07/22/2022 11:07 AM  Virtual Visit via Video Note   I, Inda Coke, connected with  Kathryn Williams  ([id number], 28-Aug-1981) on 07/22/22 at 11:00 AM EDT by a video-enabled telemedicine application and verified that I am speaking with the correct person using two identifiers.  Location: Patient: Virtual Visit Location Patient: Home Provider:  Virtual Visit Location Provider: Home Office   I discussed the limitations of evaluation and management by telemedicine and the availability of in person appointments. The patient expressed understanding and agreed to proceed.    History of Present Illness: Kathryn Williams is a 41 y.o. who identifies as a female who was assigned female at birth, and is being seen today for cough.  2-3 days ago started having a burning cough. She has uncontrollable coughing fits that are causing urinary leakage and vomiting at times. She has not had fever, chills, SOB, chest pain, LE swelling. She has used her daughter's albuterol inhaler with little relief.  She also has associated HA, fatigue. She works in [employer]. Has never had COVID. Denies concerns for pregnancy.  HPI: HPI  Problems:  Patient Active Problem List   Diagnosis Date Noted   Vitamin D insufficiency 05/09/2022    Allergies: No Known Allergies Medications:  Current Outpatient Medications:    benzonatate (TESSALON) 100 MG capsule, Take 1 capsule (100 mg total) by mouth 2 (two) times daily as needed for cough., Disp: 30 capsule, Rfl: 0   COVID-19 At Home Antigen Test KIT, Use as directed., Disp: 1 each, Rfl: 0   OZEMPIC, 1 MG/DOSE, 4 MG/3ML SOPN, Inject into the skin., Disp: , Rfl:    Vitamin D, Ergocalciferol, (DRISDOL) 1.25 MG (50000 UNIT) CAPS capsule, TAKE 1 CAPSULE (50,000 UNITS TOTAL) BY MOUTH EVERY 7 (SEVEN) DAYS FOR 8 DOSES., Disp: 8 capsule, Rfl: 0  Observations/Objective: Patient is well-developed, well-nourished in no acute  distress.  Resting comfortably  at home.  Head is normocephalic, atraumatic.  No labored breathing. Speech is clear and coherent with logical content.  Patient is alert and oriented at baseline.    Assessment and Plan: 1. Acute cough No red flags Recommend tessalon perles and delsym 12-hour cough syrup She is also going to pick up a COVID test and swab -- she will send [place] a mychart message if positive and  she would like to do paxlovid -- no obvious contraindications for this If lack of improvement, will need in person eval for vitals and physical exam  Follow Up Instructions: I discussed the assessment and treatment plan with the patient. The patient was provided an opportunity to ask questions and all were answered. The patient agreed with the plan and demonstrated an understanding of the instructions.  A copy of instructions were sent to the patient via MyChart unless otherwise noted below.    The patient was advised to call back or seek an in-person evaluation if the symptoms worsen or if the condition fails to improve as anticipated.  Time:  I spent 15 minutes with the patient via telehealth technology discussing the above problems/concerns.    Inda Coke, [place]

## 2022-07-23 ENCOUNTER — Encounter (HOSPITAL_BASED_OUTPATIENT_CLINIC_OR_DEPARTMENT_OTHER): Payer: Self-pay

## 2022-07-23 ENCOUNTER — Emergency Department (HOSPITAL_BASED_OUTPATIENT_CLINIC_OR_DEPARTMENT_OTHER): Payer: BC Managed Care – PPO

## 2022-07-23 ENCOUNTER — Emergency Department (HOSPITAL_BASED_OUTPATIENT_CLINIC_OR_DEPARTMENT_OTHER)
Admission: EM | Admit: 2022-07-23 | Discharge: 2022-07-24 | Disposition: A | Discharge status: Home / Self Care | Payer: BC Managed Care – PPO | Attending: Emergency Medicine | Admitting: Emergency Medicine

## 2022-07-23 ENCOUNTER — Other Ambulatory Visit: Payer: Self-pay

## 2022-07-23 DIAGNOSIS — J069 Acute upper respiratory infection, unspecified: Secondary | ICD-10-CM | POA: Diagnosis not present

## 2022-07-23 DIAGNOSIS — R000 Tachycardia, unspecified: Secondary | ICD-10-CM | POA: Diagnosis not present

## 2022-07-23 DIAGNOSIS — Z20822 Contact with and (suspected) exposure to covid-19: Secondary | ICD-10-CM | POA: Diagnosis not present

## 2022-07-23 DIAGNOSIS — I10 Essential (primary) hypertension: Secondary | ICD-10-CM | POA: Insufficient documentation

## 2022-07-23 DIAGNOSIS — R0602 Shortness of breath: Secondary | ICD-10-CM | POA: Diagnosis present

## 2022-07-23 LAB — CBC
HCT: 37.7 % (ref 36.0–46.0)
Hemoglobin: 12 g/dL (ref 12.0–15.0)
MCH: 23.4 pg — ABNORMAL LOW (ref 26.0–34.0)
MCHC: 31.8 g/dL (ref 30.0–36.0)
MCV: 73.6 fL — ABNORMAL LOW (ref 80.0–100.0)
Platelets: 408 10*3/uL — ABNORMAL HIGH (ref 150–400)
RBC: 5.12 MIL/uL — ABNORMAL HIGH (ref 3.87–5.11)
RDW: 14.1 % (ref 11.5–15.5)
WBC: 11.7 10*3/uL — ABNORMAL HIGH (ref 4.0–10.5)
nRBC: 0 % (ref 0.0–0.2)

## 2022-07-23 LAB — BASIC METABOLIC PANEL
Anion gap: 9 (ref 5–15)
BUN: 14 mg/dL (ref 6–20)
CO2: 26 mmol/L (ref 22–32)
Calcium: 9.2 mg/dL (ref 8.9–10.3)
Chloride: 103 mmol/L (ref 98–111)
Creatinine, Ser: 0.67 mg/dL (ref 0.44–1.00)
GFR, Estimated: >60 mL/min (ref >60)
Glucose, Bld: 121 mg/dL — ABNORMAL HIGH (ref 70–99)
Potassium: 3.8 mmol/L (ref 3.5–5.1)
Sodium: 138 mmol/L (ref 135–145)

## 2022-07-23 LAB — TROPONIN I (HIGH SENSITIVITY): Troponin I (High Sensitivity): 2 ng/L (ref <18)

## 2022-07-23 LAB — SARS CORONAVIRUS 2 BY RT PCR: SARS Coronavirus 2 by RT PCR: NEGATIVE

## 2022-07-23 NOTE — ED Triage Notes (Signed)
POV, pt sts that on Thursday she began coughing up mucous and phlegm, endorses chest tightness when coughing, SOB at rest. Pt did telehealth visit yesterday and did home covid swab with neg results. Pt denies respiratory hx or medical problems.

## 2022-07-24 ENCOUNTER — Telehealth: Payer: BC Managed Care – PPO | Admitting: Nurse Practitioner

## 2022-07-24 DIAGNOSIS — J069 Acute upper respiratory infection, unspecified: Secondary | ICD-10-CM

## 2022-07-24 LAB — TROPONIN I (HIGH SENSITIVITY): Troponin I (High Sensitivity): 2 ng/L (ref <18)

## 2022-07-24 MED ORDER — ONDANSETRON 4 MG PO TBDP
4.0000 mg | ORAL_TABLET | Freq: Three times a day (TID) | ORAL | 0 refills | Status: AC | PRN
Start: 2022-07-24 — End: ?

## 2022-07-24 MED ORDER — ALBUTEROL SULFATE HFA 108 (90 BASE) MCG/ACT IN AERS
2.0000 | INHALATION_SPRAY | Freq: Once | RESPIRATORY_TRACT | Status: AC
  Administered 2022-07-24: 2 via RESPIRATORY_TRACT
  Filled 2022-07-24: qty 6.7

## 2022-07-24 MED ORDER — METHYLPREDNISOLONE 4 MG PO TBPK: ORAL_TABLET | ORAL | 0 refills | Status: DC

## 2022-07-24 NOTE — Progress Notes (Signed)
Virtual Visit Consent   Kathryn Williams, you are scheduled for a virtual visit with a [facility] provider today. Just as with appointments in the office, your consent must be obtained to participate. Your consent will be active for this visit and any virtual visit you may have with one of our providers in the next 365 days. If you have a MyChart account, a copy of this consent can be sent to you electronically.  As this is a virtual visit, video technology does not allow for your provider to perform a traditional examination. This may limit your provider's ability to fully assess your condition. If your provider identifies any concerns that need to be evaluated in person or the need to arrange testing (such as labs, EKG, etc.), we will make arrangements to do so. Although advances in technology are sophisticated, we cannot ensure that it will always work on either your end or our end. If the connection with a video visit is poor, the visit may have to be switched to a telephone visit. With either a video or telephone visit, we are not always able to ensure that we have a secure connection.  By engaging in this virtual visit, you consent to the provision of healthcare and authorize for your insurance to be billed (if applicable) for the services provided during this visit. Depending on your insurance coverage, you may receive a charge related to this service.  I need to obtain your verbal consent now. Are you willing to proceed with your visit today? Montina Dorrance has provided verbal consent on 07/24/2022 for a virtual visit (video or telephone). Gildardo Pounds, NP  Date: 07/24/2022 11:16 AM  Virtual Visit via Video Note   I, Gildardo Pounds, connected with  Kathryn Williams  ([id number], 12/25/1980) on 07/24/22 at 11:30 AM EDT by a video-enabled telemedicine application and verified that I am speaking with the correct person using two identifiers.  Location: Patient: Virtual Visit Location Patient: Home Provider:  Virtual Visit Location Provider: Home Office   I discussed the limitations of evaluation and management by telemedicine and the availability of in person appointments. The patient expressed understanding and agreed to proceed.    History of Present Illness: Kathryn Williams is a 41 y.o. who identifies as a female who was assigned female at birth, and is being seen today for questions related to ER visit yesterday.  Dx'd with viral URI with cough yesterday after initially complainnig of several days onset of cough, shortness of breath and chest tightness. Work up including xray and EKG were normal. CBC minimally elevated WBC.  I discussed lab results and the reason she was not prescribed any antibiotics at that time. All questions were answered to her satisfaction.    Problems:  Patient Active Problem List   Diagnosis Date Noted   Vitamin D insufficiency 05/09/2022    Allergies: No Known Allergies Medications:  Current Outpatient Medications:    benzonatate (TESSALON) 100 MG capsule, Take 1 capsule (100 mg total) by mouth 2 (two) times daily as needed for cough., Disp: 30 capsule, Rfl: 0   COVID-19 At Home Antigen Test KIT, Use as directed., Disp: 1 each, Rfl: 0   methylPREDNISolone (MEDROL DOSEPAK) 4 MG TBPK tablet, Take as directed on packet, Disp: 21 tablet, Rfl: 0   ondansetron (ZOFRAN-ODT) 4 MG disintegrating tablet, Take 1 tablet (4 mg total) by mouth every 8 (eight) hours as needed for nausea or vomiting., Disp: 20 tablet, Rfl: 0   OZEMPIC, 1 MG/DOSE,  4 MG/3ML SOPN, Inject into the skin., Disp: , Rfl:    Vitamin D, Ergocalciferol, (DRISDOL) 1.25 MG (50000 UNIT) CAPS capsule, TAKE 1 CAPSULE (50,000 UNITS TOTAL) BY MOUTH EVERY 7 (SEVEN) DAYS FOR 8 DOSES., Disp: 8 capsule, Rfl: 0  Observations/Objective: Patient is well-developed, well-nourished in no acute distress.  Resting comfortably at home.  Head is normocephalic, atraumatic.  No labored breathing.  Speech is clear and coherent with  logical content.  Patient is alert and oriented at baseline.    Assessment and Plan: 1. Viral URI with cough Start prednisone prescribed by ED (she has not picked this up) INSTRUCTIONS: use a humidifier for nasal congestion Drink plenty of fluids, rest and wash hands frequently to avoid the spread of infection Alternate tylenol and Motrin for relief of fever  Follow up  if no improvement 7-10 days  Follow Up Instructions: I discussed the assessment and treatment plan with the patient. The patient was provided an opportunity to ask questions and all were answered. The patient agreed with the plan and demonstrated an understanding of the instructions.  A copy of instructions were sent to the patient via MyChart unless otherwise noted below.    The patient was advised to call back or seek an in-person evaluation if the symptoms worsen or if the condition fails to improve as anticipated.  Time:  I spent 11 minutes with the patient via telehealth technology discussing the above problems/concerns.    Gildardo Pounds, NP

## 2022-07-24 NOTE — Discharge Instructions (Signed)
You were seen today for shortness of breath and cough.  Your work-up today is reassuring including chest x-ray.  Your COVID and influenza test are negative.  You likely have a different viral URI.  Make sure that you are staying hydrated.  You will be given Zofran for any nausea and vomiting.

## 2022-07-24 NOTE — ED Provider Notes (Signed)
[facility] EMERGENCY DEPT Provider Note   CSN: [id number] Arrival date & time: 07/23/22  2149     History  Chief Complaint  Patient presents with   Shortness of Breath   Chest Pain    Kathryn Williams is a 41 y.o. female.  HPI     This is a 41 year old female who presents with cough and shortness of breath.  Patient also endorses some chest tightness.  Patient states symptoms started several days ago while she was at work.  She had a telehealth visit yesterday and was prescribed Tessalon Perles and told to take Delsym which has not really helped.  She reports coughing fits that make her vomit.  She is coughing up some phlegm.  No fevers.  She "works with the public" so is unsure of sick contacts.  Home Medications Prior to Admission medications   Medication Sig Start Date End Date Taking? Authorizing Provider  methylPREDNISolone (MEDROL DOSEPAK) 4 MG TBPK tablet Take as directed on packet 07/24/22  Yes Soriah Leeman, Barbette Hair, MD  ondansetron (ZOFRAN-ODT) 4 MG disintegrating tablet Take 1 tablet (4 mg total) by mouth every 8 (eight) hours as needed for nausea or vomiting. 07/24/22  Yes Ceili Boshers, Barbette Hair, MD  benzonatate (TESSALON) 100 MG capsule Take 1 capsule (100 mg total) by mouth 2 (two) times daily as needed for cough. 07/22/22   Inda Coke, PA  COVID-19 At [facility] Antigen Test KIT Use as directed. 07/22/22   Inda Coke, PA  OZEMPIC, 1 MG/DOSE, 4 MG/3ML SOPN Inject into the skin. 05/31/22   [provider]  Vitamin D, Ergocalciferol, (DRISDOL) 1.25 MG (50000 UNIT) CAPS capsule TAKE 1 CAPSULE (50,000 UNITS TOTAL) BY MOUTH EVERY 7 (SEVEN) DAYS FOR 8 DOSES. 07/05/22 08/24/22  Martinique, Betty G, MD      Allergies    Patient has no known allergies.    Review of Systems   Review of Systems  Constitutional:  Negative for fever.  Respiratory:  Positive for cough and shortness of breath.   Cardiovascular:  Positive for chest pain.  All other systems reviewed and are  negative.   Physical Exam Updated Vital Signs BP 117/72   Pulse (!) 108   Temp 99.4 F (37.4 C) (Oral)   Resp 18   Ht 1.727 m (5' 8" )   Wt 113.4 kg   LMP 07/04/2022   SpO2 98%   BMI 38.01 kg/m  Physical Exam Vitals and nursing note reviewed.  Constitutional:      Appearance: She is well-developed. She is obese. She is not ill-appearing.  HENT:     Head: Normocephalic and atraumatic.     Mouth/Throat:     Mouth: Mucous membranes are moist.  Eyes:     Pupils: Pupils are equal, round, and reactive to light.  Cardiovascular:     Rate and Rhythm: Normal rate and regular rhythm.     Heart sounds: Normal heart sounds.  Pulmonary:     Effort: Pulmonary effort is normal. No respiratory distress.     Breath sounds: No wheezing.  Abdominal:     General: Bowel sounds are normal.     Palpations: Abdomen is soft.  Musculoskeletal:     Cervical back: Neck supple.  Skin:    General: Skin is warm and dry.  Neurological:     Mental Status: She is alert and oriented to person, place, and time.  Psychiatric:        Mood and Affect: Mood normal.     ED  Results / Procedures / Treatments   Labs (all labs ordered are listed, but only abnormal results are displayed) Labs Reviewed  BASIC METABOLIC PANEL - Abnormal; Notable for the following components:      Result Value   Glucose, Bld 121 (*)    All other components within normal limits  CBC - Abnormal; Notable for the following components:   WBC 11.7 (*)    RBC 5.12 (*)    MCV 73.6 (*)    MCH 23.4 (*)    Platelets 408 (*)    All other components within normal limits  SARS CORONAVIRUS 2 BY RT PCR  PREGNANCY, URINE  TROPONIN I (HIGH SENSITIVITY)  TROPONIN I (HIGH SENSITIVITY)    EKG EKG Interpretation  Date/Time:  Sunday July 23 2022 21:59:39 EDT Ventricular Rate:  109 PR Interval:  130 QRS Duration: 82 QT Interval:  328 QTC Calculation: 441 R Axis:   61 Text Interpretation: Sinus tachycardia Otherwise normal  ECG No previous ECGs available Confirmed by Thayer Jew [phone]) on 07/24/2022 12:00:44 AM  Radiology DG Chest Portable 1 View  Result Date: 07/23/2022 CLINICAL DATA:  Chest pain and shortness of breath. EXAM: PORTABLE CHEST 1 VIEW COMPARISON:  Chest radiograph dated 08/10/2021. FINDINGS: The heart size and mediastinal contours are within normal limits. Both lungs are clear. The visualized skeletal structures are unremarkable. IMPRESSION: No active disease. Electronically Signed   By: Anner Crete M.D.   On: 07/23/2022 23:55    Procedures Procedures    Medications Ordered in ED Medications  albuterol (VENTOLIN HFA) 108 (90 Base) MCG/ACT inhaler 2 puff (2 puffs Inhalation Given 07/24/22 0058)    ED Course/ Medical Decision Making/ A&P                           Medical Decision Making Amount and/or Complexity of Data Reviewed Labs: ordered. Radiology: ordered.  Risk Prescription drug management.   This patient presents to the ED for concern of cough, shortness of breath, this involves an extensive number of treatment options, and is a complaint that carries with it a high risk of complications and morbidity.  I considered the following differential and admission for this acute, potentially life threatening condition.  The differential diagnosis includes viral URI such as COVID or influenza, pneumonia, reactive airway disease, bronchitis, doubt primary ACS  MDM:    This is a 41 year old female who presents with cough and posttussive emesis.  She is nontoxic.  Vital signs notable for mild tachycardia 10 2-1 08.  Temperature is 99.4.  Chest x-ray is clear without evidence of pneumothorax or pneumonia.  COVID and influenza testing are negative.  EKG and troponins are negative and reassuring.  Lab work is reassuring.  Patient was given albuterol and Zofran.  We will treat for bronchitis given significance of cough.  She was given a Medrol Dosepak.  Recommend supportive measures.  (Labs,  imaging, consults)  Labs: I Ordered, and personally interpreted labs.  The pertinent results include: CBC, BMP, COVID and influenza testing, troponin x2  Imaging Studies ordered: I ordered imaging studies including chest x-ray without pneumothorax or pneumonia I independently visualized and interpreted imaging. I agree with the radiologist interpretation  Additional history obtained from chart review.  External records from outside source obtained and reviewed including prior evaluations and telemedicine visit  Cardiac Monitoring: The patient was maintained on a cardiac monitor.  I personally viewed and interpreted the cardiac monitored which showed an underlying rhythm  of: Normal sinus rhythm  Reevaluation: After the interventions noted above, I reevaluated the patient and found that they have :improved  Social Determinants of Health: Lives independently  Disposition: Discharge  Co morbidities that complicate the patient evaluation  Past Medical History:  Diagnosis Date   Anemia    Hypertension      Medicines Meds ordered this encounter  Medications   albuterol (VENTOLIN HFA) 108 (90 Base) MCG/ACT inhaler 2 puff   ondansetron (ZOFRAN-ODT) 4 MG disintegrating tablet    Sig: Take 1 tablet (4 mg total) by mouth every 8 (eight) hours as needed for nausea or vomiting.    Dispense:  20 tablet    Refill:  0   methylPREDNISolone (MEDROL DOSEPAK) 4 MG TBPK tablet    Sig: Take as directed on packet    Dispense:  21 tablet    Refill:  0    I have reviewed the patients home medicines and have made adjustments as needed  Problem List / ED Course: Problem List Items Addressed This Visit   None Visit Diagnoses     Viral URI with cough    -  Primary                   Final Clinical Impression(s) / ED Diagnoses Final diagnoses:  Viral URI with cough    Rx / DC Orders ED Discharge Orders          Ordered    ondansetron (ZOFRAN-ODT) 4 MG disintegrating tablet   Every 8 hours PRN        07/24/22 0155    methylPREDNISolone (MEDROL DOSEPAK) 4 MG TBPK tablet        07/24/22 0206              Merryl Hacker, MD 07/24/22 [phone]

## 2022-07-24 NOTE — Patient Instructions (Signed)
  Kathryn Williams, thank you for joining Gildardo Pounds, NP for today's virtual visit.  While this provider is not your primary care provider (PCP), if your PCP is located in our provider database this encounter information will be shared with them immediately following your visit.  Consent: (Patient) Kathryn Williams provided verbal consent for this virtual visit at the beginning of the encounter.  Current Medications:  Current Outpatient Medications:    benzonatate (TESSALON) 100 MG capsule, Take 1 capsule (100 mg total) by mouth 2 (two) times daily as needed for cough., Disp: 30 capsule, Rfl: 0   COVID-19 At Home Antigen Test KIT, Use as directed., Disp: 1 each, Rfl: 0   methylPREDNISolone (MEDROL DOSEPAK) 4 MG TBPK tablet, Take as directed on packet, Disp: 21 tablet, Rfl: 0   ondansetron (ZOFRAN-ODT) 4 MG disintegrating tablet, Take 1 tablet (4 mg total) by mouth every 8 (eight) hours as needed for nausea or vomiting., Disp: 20 tablet, Rfl: 0   OZEMPIC, 1 MG/DOSE, 4 MG/3ML SOPN, Inject into the skin., Disp: , Rfl:    Vitamin D, Ergocalciferol, (DRISDOL) 1.25 MG (50000 UNIT) CAPS capsule, TAKE 1 CAPSULE (50,000 UNITS TOTAL) BY MOUTH EVERY 7 (SEVEN) DAYS FOR 8 DOSES., Disp: 8 capsule, Rfl: 0   Medications ordered in this encounter:  No orders of the defined types were placed in this encounter.    *If you need refills on other medications prior to your next appointment, please contact your pharmacy*  Follow-Up: Call back or seek an in-person evaluation if the symptoms worsen or if the condition fails to improve as anticipated.  Other Instructions INSTRUCTIONS: use a humidifier for nasal congestion Drink plenty of fluids, rest and wash hands frequently to avoid the spread of infection Alternate tylenol and Motrin for relief of fever  Follow up  if no improvement 7-10 days   If you have been instructed to have an in-person evaluation today at a local Urgent Care facility, please use the link below.  It will take you to a list of all of our available [facility], including address, phone number and hours of operation. Please do not delay care.  [facility]  If you or a family member do not have a primary care provider, use the link below to schedule a visit and establish care. When you choose a [facility] primary care physician or advanced practice provider, you gain a long-term partner in health. Find a Primary Care Provider  Learn more about [facility]'s in-office and virtual care options: [facility]

## 2022-07-24 NOTE — ED Notes (Signed)
Pt verbalizes understanding of discharge instructions. Opportunity for questioning and answers were provided. Pt discharged from ED to home.   ? ?

## 2022-11-06 ENCOUNTER — Other Ambulatory Visit: Payer: Self-pay | Admitting: Radiology

## 2022-11-06 DIAGNOSIS — N762 Acute vulvitis: Secondary | ICD-10-CM

## 2022-11-06 NOTE — Telephone Encounter (Signed)
Med refill request:Clobetasol ointment  Last AEX: 05/16/22 [place] Next AEX: Not scheduled Last MMG (if hormonal med) N/A  Last Rx sent 05/16/22 for acute vulvitis   Refill authorized: Please Advise?

## 2022-12-29 ENCOUNTER — Telehealth: Payer: Self-pay | Admitting: *Deleted

## 2022-12-29 NOTE — Telephone Encounter (Signed)
PUS ordered 05/16/22 for menorrhagia.  Not scheduled to date.  Last AEX 05/16/22 with Wende Crease, NP.   Call placed to patient, left detailed message, ok per dpr. Advised to return call to [place], [place] at [phone], OPT 5 to provide update on menses.

## 2023-02-23 NOTE — Telephone Encounter (Signed)
Left message to call Noreene Larsson, RN at [facility], [phone], option 5.  MyChart message to patient.

## 2023-03-31 ENCOUNTER — Ambulatory Visit (HOSPITAL_COMMUNITY): Payer: Self-pay

## 2023-04-01 ENCOUNTER — Ambulatory Visit (HOSPITAL_COMMUNITY): Payer: Self-pay

## 2023-04-27 NOTE — Telephone Encounter (Signed)
No return call from patient.  MyChart message not read.   Jami -please advise. Ok to cancel PUS order?

## 2023-04-27 NOTE — Telephone Encounter (Signed)
Order cancelled.   Encounter closed.  

## 2023-04-27 NOTE — Telephone Encounter (Signed)
Ok to cancel

## 2023-10-22 ENCOUNTER — Telehealth: Payer: PRIVATE HEALTH INSURANCE | Admitting: Physician Assistant

## 2023-10-22 DIAGNOSIS — B379 Candidiasis, unspecified: Secondary | ICD-10-CM | POA: Diagnosis not present

## 2023-10-22 DIAGNOSIS — B9689 Other specified bacterial agents as the cause of diseases classified elsewhere: Secondary | ICD-10-CM

## 2023-10-22 DIAGNOSIS — T3695XA Adverse effect of unspecified systemic antibiotic, initial encounter: Secondary | ICD-10-CM | POA: Diagnosis not present

## 2023-10-22 DIAGNOSIS — H109 Unspecified conjunctivitis: Secondary | ICD-10-CM | POA: Diagnosis not present

## 2023-10-22 MED ORDER — FLUCONAZOLE 150 MG PO TABS: 150.0000 mg | ORAL_TABLET | ORAL | 0 refills | Status: AC | PRN

## 2023-10-22 MED ORDER — AMOXICILLIN-POT CLAVULANATE 875-125 MG PO TABS: 1.0000 | ORAL_TABLET | Freq: Two times a day (BID) | ORAL | 0 refills | Status: AC

## 2023-10-22 MED ORDER — MOXIFLOXACIN HCL 0.5 % OP SOLN: 1.0000 [drp] | Freq: Three times a day (TID) | OPHTHALMIC | 0 refills | Status: DC

## 2023-10-22 NOTE — Progress Notes (Signed)
Virtual Visit Consent   Kathryn Williams, you are scheduled for a virtual visit with a [facility] provider today. Just as with appointments in the office, your consent must be obtained to participate. Your consent will be active for this visit and any virtual visit you may have with one of our providers in the next 365 days. If you have a MyChart account, a copy of this consent can be sent to you electronically.  As this is a virtual visit, video technology does not allow for your provider to perform a traditional examination. This may limit your provider's ability to fully assess your condition. If your provider identifies any concerns that need to be evaluated in person or the need to arrange testing (such as labs, EKG, etc.), we will make arrangements to do so. Although advances in technology are sophisticated, we cannot ensure that it will always work on either your end or our end. If the connection with a video visit is poor, the visit may have to be switched to a telephone visit. With either a video or telephone visit, we are not always able to ensure that we have a secure connection.  By engaging in this virtual visit, you consent to the provision of healthcare and authorize for your insurance to be billed (if applicable) for the services provided during this visit. Depending on your insurance coverage, you may receive a charge related to this service.  I need to obtain your verbal consent now. Are you willing to proceed with your visit today? Kathryn Williams has provided verbal consent on 10/22/2023 for a virtual visit (video or telephone). [name], PA-C  Date: 10/22/2023 3:34 PM  Virtual Visit via Video Note   I, [name], connected with  Kathryn Williams  ([id number], 1981/06/08) on 10/22/23 at  3:30 PM EST by a video-enabled telemedicine application and verified that I am speaking with the correct person using two identifiers.  Location: Patient: Virtual Visit Location Patient:  Home Provider: Virtual Visit Location Provider: Home Office   I discussed the limitations of evaluation and management by telemedicine and the availability of in person appointments. The patient expressed understanding and agreed to proceed.    History of Present Illness: Kathryn Williams is a 42 y.o. who identifies as a female who was assigned female at birth, and is being seen today for URI symptoms.  HPI: URI  This is a new problem. The current episode started in the past 7 days. The problem has been gradually worsening. There has been no fever. Associated symptoms include congestion, headaches, rhinorrhea, sinus pain (maxillary), a sore throat and swollen glands. Pertinent negatives include no coughing, ear pain, nausea or plugged ear sensation. Associated symptoms comments: Bilateral eye drainage, irritation and crusting today. She has tried NSAIDs and increased fluids for the symptoms. The treatment provided no relief.     Problems:  Patient Active Problem List   Diagnosis Date Noted   Vitamin D insufficiency 05/09/2022    Allergies: No Known Allergies Medications:  Current Outpatient Medications:    amoxicillin-clavulanate (AUGMENTIN) 875-125 MG tablet, Take 1 tablet by mouth 2 (two) times daily., Disp: 14 tablet, Rfl: 0   moxifloxacin (VIGAMOX) 0.5 % ophthalmic solution, Place 1 drop into both eyes 3 (three) times daily., Disp: 3 mL, Rfl: 0   benzonatate (TESSALON) 100 MG capsule, Take 1 capsule (100 mg total) by mouth 2 (two) times daily as needed for cough., Disp: 30 capsule, Rfl: 0   clobetasol ointment (TEMOVATE) 0.05 %,  APPLY TO AFFECTED AREA TWICE A DAY, Disp: 30 g, Rfl: 0   COVID-19 At Home Antigen Test KIT, Use as directed., Disp: 1 each, Rfl: 0   methylPREDNISolone (MEDROL DOSEPAK) 4 MG TBPK tablet, Take as directed on packet, Disp: 21 tablet, Rfl: 0   ondansetron (ZOFRAN-ODT) 4 MG disintegrating tablet, Take 1 tablet (4 mg total) by mouth every 8 (eight) hours as needed for  nausea or vomiting., Disp: 20 tablet, Rfl: 0   OZEMPIC, 1 MG/DOSE, 4 MG/3ML SOPN, Inject into the skin., Disp: , Rfl:   Observations/Objective: Patient is well-developed, well-nourished in no acute distress.  Resting comfortably at home.  Head is normocephalic, atraumatic.  No labored breathing.  Speech is clear and coherent with logical content.  Patient is alert and oriented at baseline.  Bilateral eyes injected and red  Assessment and Plan: 1. Bacterial conjunctivitis of both eyes - amoxicillin-clavulanate (AUGMENTIN) 875-125 MG tablet; Take 1 tablet by mouth 2 (two) times daily.  Dispense: 14 tablet; Refill: 0 - moxifloxacin (VIGAMOX) 0.5 % ophthalmic solution; Place 1 drop into both eyes 3 (three) times daily.  Dispense: 3 mL; Refill: 0  - Suspect bacterial conjunctivitis with possible secondary URI/Sinus symptoms - Augmentin and Vigamox prescribed - Warm compresses - Good hand hygiene - Seek in person evaluation if symptoms worsen or fail to improve   Follow Up Instructions: I discussed the assessment and treatment plan with the patient. The patient was provided an opportunity to ask questions and all were answered. The patient agreed with the plan and demonstrated an understanding of the instructions.  A copy of instructions were sent to the patient via MyChart unless otherwise noted below.    The patient was advised to call back or seek an in-person evaluation if the symptoms worsen or if the condition fails to improve as anticipated.    [name], PA-C

## 2023-10-22 NOTE — Patient Instructions (Signed)
[name], thank you for joining [name], PA-C for today's virtual visit.  While this provider is not your primary care provider (PCP), if your PCP is located in our provider database this encounter information will be shared with them immediately following your visit.   A [facility] MyChart account gives you access to today's visit and all your visits, tests, and labs performed at [facility] " click here if you don't have a [facility] MyChart account or go to [email or web address]  Consent: (Patient) Kathryn Williams provided verbal consent for this virtual visit at the beginning of the encounter.  Current Medications:  Current Outpatient Medications:    amoxicillin-clavulanate (AUGMENTIN) 875-125 MG tablet, Take 1 tablet by mouth 2 (two) times daily., Disp: 14 tablet, Rfl: 0   fluconazole (DIFLUCAN) 150 MG tablet, Take 1 tablet (150 mg total) by mouth every 3 (three) days as needed., Disp: 2 tablet, Rfl: 0   moxifloxacin (VIGAMOX) 0.5 % ophthalmic solution, Place 1 drop into both eyes 3 (three) times daily., Disp: 3 mL, Rfl: 0   benzonatate (TESSALON) 100 MG capsule, Take 1 capsule (100 mg total) by mouth 2 (two) times daily as needed for cough., Disp: 30 capsule, Rfl: 0   clobetasol ointment (TEMOVATE) 0.05 %, APPLY TO AFFECTED AREA TWICE A DAY, Disp: 30 g, Rfl: 0   COVID-19 At Home Antigen Test KIT, Use as directed., Disp: 1 each, Rfl: 0   methylPREDNISolone (MEDROL DOSEPAK) 4 MG TBPK tablet, Take as directed on packet, Disp: 21 tablet, Rfl: 0   ondansetron (ZOFRAN-ODT) 4 MG disintegrating tablet, Take 1 tablet (4 mg total) by mouth every 8 (eight) hours as needed for nausea or vomiting., Disp: 20 tablet, Rfl: 0   OZEMPIC, 1 MG/DOSE, 4 MG/3ML SOPN, Inject into the skin., Disp: , Rfl:    Medications ordered in this encounter:  Meds ordered this encounter  Medications   amoxicillin-clavulanate (AUGMENTIN) 875-125 MG tablet    Sig: Take 1 tablet by mouth 2 (two)  times daily.    Dispense:  14 tablet    Refill:  0    Order Specific Question:   Supervising Provider    Answer:   Merrilee Jansky [[id number]]   moxifloxacin (VIGAMOX) 0.5 % ophthalmic solution    Sig: Place 1 drop into both eyes 3 (three) times daily.    Dispense:  3 mL    Refill:  0    Order Specific Question:   Supervising Provider    Answer:   Merrilee Jansky [[id number]]   fluconazole (DIFLUCAN) 150 MG tablet    Sig: Take 1 tablet (150 mg total) by mouth every 3 (three) days as needed.    Dispense:  2 tablet    Refill:  0    Order Specific Question:   Supervising Provider    Answer:   Merrilee Jansky [id number]     *If you need refills on other medications prior to your next appointment, please contact your pharmacy*  Follow-Up: Call back or seek an in-person evaluation if the symptoms worsen or if the condition fails to improve as anticipated.  [facility] [phone]  Other Instructions Bacterial Conjunctivitis, Adult Bacterial conjunctivitis is an infection of the clear membrane that covers the white part of the eye and the inner surface of the eyelid (conjunctiva). When the blood vessels in the conjunctiva become inflamed, the eye becomes red or pink. The eye often feels irritated or itchy. Bacterial conjunctivitis spreads easily from person  to person (is contagious). It also spreads easily from one eye to the other eye. What are the causes? This condition is caused by bacteria. You may get the infection if you come into close contact with: A person who is infected with the bacteria. Items that are contaminated with the bacteria, such as a face towel, contact lens solution, or eye makeup. What increases the risk? You are more likely to develop this condition if: You are exposed to other people who have the infection. You wear contact lenses. You have a sinus infection. You have had a recent eye injury or surgery. You have a weak body defense system  (immune system). You have a medical condition that causes dry eyes. What are the signs or symptoms? Symptoms of this condition include: Thick, yellowish discharge from the eye. This may turn into a crust on the eyelid overnight and cause your eyelids to stick together. Tearing or watery eyes. Itchy eyes. Burning feeling in your eyes. Eye redness. Swollen eyelids. Blurred vision. How is this diagnosed? This condition is diagnosed based on your symptoms and medical history. Your health care provider may also take a sample of discharge from your eye to find the cause of your infection. How is this treated? This condition may be treated with: Antibiotic eye drops or ointment to clear the infection more quickly and prevent the spread of infection to others. Antibiotic medicines taken by mouth (orally) to treat infections that do not respond to drops or ointments or that last longer than 10 days. Cool, wet cloths (cool compresses) placed on the eyes. Artificial tears applied 2-6 times a day. Follow these instructions at home: Medicines Take or apply your antibiotic medicine as told by your health care provider. Do not stop using the antibiotic, even if your condition improves, unless directed by your health care provider. Take or apply over-the-counter and prescription medicines only as told by your health care provider. Be very careful to avoid touching the edge of your eyelid with the eye-drop bottle or the ointment tube when you apply medicines to the affected eye. This will keep you from spreading the infection to your other eye or to other people. Managing discomfort Gently wipe away any drainage from your eye with a warm, wet washcloth or a cotton ball. Apply a clean, cool compress to your eye for 10-20 minutes, 3-4 times a day. General instructions Do not wear contact lenses until the inflammation is gone and your health care provider says it is safe to wear them again. Ask your health  care provider how to sterilize or replace your contact lenses before you use them again. Wear glasses until you can resume wearing contact lenses. Avoid wearing eye makeup until the inflammation is gone. Throw away any old eye cosmetics that may be contaminated. Change or wash your pillowcase every day. Do not share towels or washcloths. This may spread the infection. Wash your hands often with soap and water for at least 20 seconds and especially before touching your face or eyes. Use paper towels to dry your hands. Avoid touching or rubbing your eyes. Do not drive or use heavy machinery if your vision is blurred. Contact a health care provider if: You have a fever. Your symptoms do not get better after 10 days. Get help right away if: You have a fever and your symptoms suddenly get worse. You have severe pain when you move your eye. You have facial pain, redness, or swelling. You have a sudden loss of  vision. Summary Bacterial conjunctivitis is an infection of the clear membrane that covers the white part of the eye and the inner surface of the eyelid (conjunctiva). Bacterial conjunctivitis spreads easily from eye to eye and from person to person (is contagious). Wash your hands often with soap and water for at least 20 seconds and especially before touching your face or eyes. Use paper towels to dry your hands. Take or apply your antibiotic medicine as told by your health care provider. Do not stop using the antibiotic even if your condition improves. Contact a health care provider if you have a fever or if your symptoms do not get better after 10 days. Get help right away if you have a sudden loss of vision. This information is not intended to replace advice given to you by your health care provider. Make sure you discuss any questions you have with your health care provider. Document Revised: 02/16/2021 Document Reviewed: 02/16/2021 Elsevier Patient Education  2024 Elsevier  Inc.    If you have been instructed to have an in-person evaluation today at a local Urgent Care facility, please use the link below. It will take you to a list of all of our available [facility], including address, phone number and hours of operation. Please do not delay care.  [facility]  If you or a family member do not have a primary care provider, use the link below to schedule a visit and establish care. When you choose a [facility] primary care physician or advanced practice provider, you gain a long-term partner in health. Find a Primary Care Provider  Learn more about [facility]'s in-office and virtual care options: [facility] - Get Care Now

## 2023-10-28 ENCOUNTER — Telehealth: Payer: PRIVATE HEALTH INSURANCE | Admitting: Family Medicine

## 2023-10-28 DIAGNOSIS — J029 Acute pharyngitis, unspecified: Secondary | ICD-10-CM | POA: Diagnosis not present

## 2023-10-28 DIAGNOSIS — J069 Acute upper respiratory infection, unspecified: Secondary | ICD-10-CM

## 2023-10-28 MED ORDER — PREDNISONE 20 MG PO TABS
20.0000 mg | ORAL_TABLET | Freq: Two times a day (BID) | ORAL | 0 refills | Status: AC
Start: 2023-10-28 — End: 2023-11-02

## 2023-10-28 MED ORDER — BENZONATATE 200 MG PO CAPS: 200.0000 mg | ORAL_CAPSULE | Freq: Two times a day (BID) | ORAL | 0 refills | Status: AC | PRN

## 2023-10-28 NOTE — Progress Notes (Signed)
Virtual Visit Consent   Kathryn Williams, you are scheduled for a virtual visit with a [facility] provider today. Just as with appointments in the office, your consent must be obtained to participate. Your consent will be active for this visit and any virtual visit you may have with one of our providers in the next 365 days. If you have a MyChart account, a copy of this consent can be sent to you electronically.  As this is a virtual visit, video technology does not allow for your provider to perform a traditional examination. This may limit your provider's ability to fully assess your condition. If your provider identifies any concerns that need to be evaluated in person or the need to arrange testing (such as labs, EKG, etc.), we will make arrangements to do so. Although advances in technology are sophisticated, we cannot ensure that it will always work on either your end or our end. If the connection with a video visit is poor, the visit may have to be switched to a telephone visit. With either a video or telephone visit, we are not always able to ensure that we have a secure connection.  By engaging in this virtual visit, you consent to the provision of healthcare and authorize for your insurance to be billed (if applicable) for the services provided during this visit. Depending on your insurance coverage, you may receive a charge related to this service.  I need to obtain your verbal consent now. Are you willing to proceed with your visit today? Kaliann Wolfington has provided verbal consent on 10/28/2023 for a virtual visit (video or telephone). Georgana Curio, FNP  Date: 10/28/2023 8:55 AM  Virtual Visit via Video Note   I, Georgana Curio, connected with  Nickie Woloszyk  ([id number], 01-Jul-1981) on 10/28/23 at  8:45 AM EST by a video-enabled telemedicine application and verified that I am speaking with the correct person using two identifiers.  Location: Patient: Virtual Visit Location Patient: Home Provider: Virtual  Visit Location Provider: Home Office   I discussed the limitations of evaluation and management by telemedicine and the availability of in person appointments. The patient expressed understanding and agreed to proceed.    History of Present Illness: Kathryn Williams is a 42 y.o. who identifies as a female who was assigned female at birth, and is being seen today for sore throat, dry cough, on augmentin, conjunctivitits cleared up, no fever. No wheezing, no sob. Seen via video and sx persist. .  HPI: HPI  Problems:  Patient Active Problem List   Diagnosis Date Noted   Vitamin D insufficiency 05/09/2022    Allergies: No Known Allergies Medications:  Current Outpatient Medications:    benzonatate (TESSALON) 200 MG capsule, Take 1 capsule (200 mg total) by mouth 2 (two) times daily as needed for cough., Disp: 20 capsule, Rfl: 0   predniSONE (DELTASONE) 20 MG tablet, Take 1 tablet (20 mg total) by mouth 2 (two) times daily with a meal for 5 days., Disp: 10 tablet, Rfl: 0   amoxicillin-clavulanate (AUGMENTIN) 875-125 MG tablet, Take 1 tablet by mouth 2 (two) times daily., Disp: 14 tablet, Rfl: 0   benzonatate (TESSALON) 100 MG capsule, Take 1 capsule (100 mg total) by mouth 2 (two) times daily as needed for cough., Disp: 30 capsule, Rfl: 0   clobetasol ointment (TEMOVATE) 0.05 %, APPLY TO AFFECTED AREA TWICE A DAY, Disp: 30 g, Rfl: 0   COVID-19 At Home Antigen Test KIT, Use as directed., Disp: 1 each, Rfl: 0  fluconazole (DIFLUCAN) 150 MG tablet, Take 1 tablet (150 mg total) by mouth every 3 (three) days as needed., Disp: 2 tablet, Rfl: 0   methylPREDNISolone (MEDROL DOSEPAK) 4 MG TBPK tablet, Take as directed on packet, Disp: 21 tablet, Rfl: 0   moxifloxacin (VIGAMOX) 0.5 % ophthalmic solution, Place 1 drop into both eyes 3 (three) times daily., Disp: 3 mL, Rfl: 0   ondansetron (ZOFRAN-ODT) 4 MG disintegrating tablet, Take 1 tablet (4 mg total) by mouth every 8 (eight) hours as needed for nausea or  vomiting., Disp: 20 tablet, Rfl: 0   OZEMPIC, 1 MG/DOSE, 4 MG/3ML SOPN, Inject into the skin., Disp: , Rfl:   Observations/Objective: Patient is well-developed, well-nourished in no acute distress.  Resting comfortably  at home.  Head is normocephalic, atraumatic.  No labored breathing.  Speech is clear and coherent with logical content.  Patient is alert and oriented at baseline.    Assessment and Plan: 1. Viral URI with cough  2. Pharyngitis, unspecified etiology  Increase fluids, humidifier at night, warm salt water gargles, UC if sx worsen. Complete augmentin.   Follow Up Instructions: I discussed the assessment and treatment plan with the patient. The patient was provided an opportunity to ask questions and all were answered. The patient agreed with the plan and demonstrated an understanding of the instructions.  A copy of instructions were sent to the patient via MyChart unless otherwise noted below.     The patient was advised to call back or seek an in-person evaluation if the symptoms worsen or if the condition fails to improve as anticipated.    Georgana Curio, FNP

## 2023-10-28 NOTE — Patient Instructions (Signed)
Cough, Adult Coughing is a reflex that clears your throat and airways (respiratory system). It helps heal and protect your lungs. It is normal to cough from time to time. A cough that happens with other symptoms or that lasts a long time may be a sign of a condition that needs treatment. A short-term (acute) cough may only last 2-3 weeks. A long-term (chronic) cough may last 8 or more weeks. Coughing is often caused by: Diseases, such as: An infection of the respiratory system. Asthma or other heart or lung diseases. Gastroesophageal reflux. This is when acid comes back up from the stomach. Breathing in things that irritate your lungs. Allergies. Postnasal drip. This is when mucus runs down the back of your throat. Smoking. Some medicines. Follow these instructions at home: Medicines Take over-the-counter and prescription medicines only as told by your health care provider. Talk with your provider before you take cough medicine (cough suppressants). Eating and drinking Do not drink alcohol. Avoid caffeine. Drink enough fluid to keep your pee (urine) pale yellow. Lifestyle Avoid cigarette smoke. Do not use any products that contain nicotine or tobacco. These products include cigarettes, chewing tobacco, and vaping devices, such as e-cigarettes. If you need help quitting, ask your provider. Avoid things that make you cough. These may include perfumes, candles, cleaning products, or campfire smoke. General instructions  Watch for any changes to your cough. Tell your provider about them. Always cover your mouth when you cough. If the air is dry in your bedroom or home, use a cool mist vaporizer or humidifier. If your cough is worse at night, try to sleep in a semi-upright position. Rest as needed. Contact a health care provider if: You have new symptoms, or your symptoms get worse. You cough up pus. You have a fever that does not go away or a cough that does not get better after 2-3  weeks. You cannot control your cough with medicine, and you are losing sleep. You have pain that gets worse or is not helped with medicine. You lose weight for no clear reason. You have night sweats. Get help right away if: You cough up blood. You have trouble breathing. Your heart is beating very fast. These symptoms may be an emergency. Get help right away. Call 911. Do not wait to see if the symptoms will go away. Do not drive yourself to the hospital. This information is not intended to replace advice given to you by your health care provider. Make sure you discuss any questions you have with your health care provider. Document Revised: 07/07/2022 Document Reviewed: 07/07/2022 Elsevier Patient Education  2024 Elsevier Inc. Sore Throat A sore throat is pain, burning, irritation, or scratchiness in the throat. When you have a sore throat, you may feel pain or tenderness in your throat when you swallow or talk. Many things can cause a sore throat, including: An infection. Seasonal allergies. Dryness in the air. Irritants, such as smoke or pollution. Radiation treatment for cancer. Gastroesophageal reflux disease (GERD). A tumor. A sore throat is often the first sign of another sickness. It may happen with other symptoms, such as coughing, sneezing, fever, and swollen neck glands. Most sore throats go away without medical treatment. Follow these instructions at home:     Medicines Take over-the-counter and prescription medicines only as told by your health care provider. Children often get sore throats. Do not give your child aspirin because of the association with Reye's syndrome. Use throat sprays to soothe your throat as told  by your health care provider. Managing pain To help with pain, try: Sipping warm liquids, such as broth, herbal tea, or warm water. Eating or drinking cold or frozen liquids, such as frozen ice pops. Gargling with a mixture of salt and water 3-4 times a  day or as needed. To make salt water, completely dissolve -1 tsp (3-6 g) of salt in 1 cup (237 mL) of warm water. Sucking on hard candy or throat lozenges. Putting a cool-mist humidifier in your bedroom at night to moisten the air. Sitting in the bathroom with the door closed for 5-10 minutes while you run hot water in the shower. General instructions Do not use any products that contain nicotine or tobacco. These products include cigarettes, chewing tobacco, and vaping devices, such as e-cigarettes. If you need help quitting, ask your health care provider. Rest as needed. Drink enough fluid to keep your urine pale yellow. Wash your hands often with soap and water for at least 20 seconds. If soap and water are not available, use hand sanitizer. Contact a health care provider if: You have a fever for more than 2-3 days. You have symptoms that last for more than 2-3 days. Your throat does not get better within 7 days. You have a fever and your symptoms suddenly get worse. Get help right away if: You have difficulty breathing. You cannot swallow fluids, soft foods, or your saliva. You have increased swelling in your throat or neck. You have persistent nausea and vomiting. These symptoms may represent a serious problem that is an emergency. Do not wait to see if the symptoms will go away. Get medical help right away. Call your local emergency services (911 in the [place] Do not drive yourself to the hospital. Summary A sore throat is pain, burning, irritation, or scratchiness in the throat. Many things can cause a sore throat. Take over-the-counter medicines only as told by your health care provider. Rest as needed. Drink enough fluid to keep your urine pale yellow. Contact a health care provider if your throat does not get better within 7 days. This information is not intended to replace advice given to you by your health care provider. Make sure you discuss any questions you have with your  health care provider. Document Revised: 02/02/2021 Document Reviewed: 02/02/2021 Elsevier Patient Education  2024 [employer].

## 2023-11-04 ENCOUNTER — Telehealth: Payer: PRIVATE HEALTH INSURANCE

## 2023-11-05 ENCOUNTER — Telehealth: Payer: PRIVATE HEALTH INSURANCE | Admitting: Physician Assistant

## 2023-11-05 DIAGNOSIS — H109 Unspecified conjunctivitis: Secondary | ICD-10-CM | POA: Diagnosis not present

## 2023-11-05 DIAGNOSIS — J069 Acute upper respiratory infection, unspecified: Secondary | ICD-10-CM

## 2023-11-05 DIAGNOSIS — B9689 Other specified bacterial agents as the cause of diseases classified elsewhere: Secondary | ICD-10-CM | POA: Diagnosis not present

## 2023-11-05 MED ORDER — AZITHROMYCIN 250 MG PO TABS: ORAL_TABLET | ORAL | 0 refills | Status: AC

## 2023-11-05 MED ORDER — MOXIFLOXACIN HCL 0.5 % OP SOLN: 1.0000 [drp] | Freq: Three times a day (TID) | OPHTHALMIC | 0 refills | Status: AC

## 2023-11-05 NOTE — Progress Notes (Signed)
Virtual Visit Consent   Kathryn Williams, you are scheduled for a virtual visit with a [facility] provider today. Just as with appointments in the office, your consent must be obtained to participate. Your consent will be active for this visit and any virtual visit you may have with one of our providers in the next 365 days. If you have a MyChart account, a copy of this consent can be sent to you electronically.  As this is a virtual visit, video technology does not allow for your provider to perform a traditional examination. This may limit your provider's ability to fully assess your condition. If your provider identifies any concerns that need to be evaluated in person or the need to arrange testing (such as labs, EKG, etc.), we will make arrangements to do so. Although advances in technology are sophisticated, we cannot ensure that it will always work on either your end or our end. If the connection with a video visit is poor, the visit may have to be switched to a telephone visit. With either a video or telephone visit, we are not always able to ensure that we have a secure connection.  By engaging in this virtual visit, you consent to the provision of healthcare and authorize for your insurance to be billed (if applicable) for the services provided during this visit. Depending on your insurance coverage, you may receive a charge related to this service.  I need to obtain your verbal consent now. Are you willing to proceed with your visit today? Mileidy Grandinetti has provided verbal consent on 11/05/2023 for a virtual visit (video or telephone). [name], PA-C  Date: 11/05/2023 7:52 AM  Virtual Visit via Video Note   I, [name], connected with  Kathryn Williams  ([id number], [birth year]) on 11/05/23 at  7:45 AM EST by a video-enabled telemedicine application and verified that I am speaking with the correct person using two identifiers.  Location: Patient: Virtual Visit Location Patient:  Home Provider: Virtual Visit Location Provider: Home Office   I discussed the limitations of evaluation and management by telemedicine and the availability of in person appointments. The patient expressed understanding and agreed to proceed.    History of Present Illness: Kathryn Williams is a 42 y.o. who identifies as a female who was assigned female at birth, and is being seen today for recurrent conjunctivitis and URI symptoms.  HPI: Conjunctivitis  The current episode started 2 days ago (accidentally used an old eye makeup product previously used at previous eye infection, had thought she had thrown all away, but missed one). The onset was sudden. The problem occurs continuously. The problem has been gradually improving. The problem is mild. The symptoms are relieved by one or more prescription drugs (Vigamox antibiotic eye drops; started using old Rx from 10/22/23, and was improving, but ran out). Nothing aggravates the symptoms. Associated symptoms include eye itching, congestion, headaches, rhinorrhea, sore throat, cough, URI, eye discharge and eye redness. Pertinent negatives include no orthopnea, no fever, no decreased vision, no photophobia, no abdominal pain, no nausea, no ear discharge, no ear pain, no muscle aches, no neck pain, no neck stiffness, no wheezing and no eye pain. The eye pain is mild. The right eye is affected. The eye pain is not associated with movement. The eyelid exhibits redness.     Problems:  Patient Active Problem List   Diagnosis Date Noted   Vitamin D insufficiency 05/09/2022    Allergies: No Known Allergies Medications:  Current Outpatient  Medications:    azithromycin (ZITHROMAX) 250 MG tablet, Take 2 tablets on day 1, then 1 tablet daily on days 2 through 5, Disp: 6 tablet, Rfl: 0   moxifloxacin (VIGAMOX) 0.5 % ophthalmic solution, Place 1 drop into the right eye 3 (three) times daily., Disp: 3 mL, Rfl: 0   amoxicillin-clavulanate (AUGMENTIN) 875-125 MG tablet,  Take 1 tablet by mouth 2 (two) times daily., Disp: 14 tablet, Rfl: 0   benzonatate (TESSALON) 100 MG capsule, Take 1 capsule (100 mg total) by mouth 2 (two) times daily as needed for cough., Disp: 30 capsule, Rfl: 0   benzonatate (TESSALON) 200 MG capsule, Take 1 capsule (200 mg total) by mouth 2 (two) times daily as needed for cough., Disp: 20 capsule, Rfl: 0   clobetasol ointment (TEMOVATE) 0.05 %, APPLY TO AFFECTED AREA TWICE A DAY, Disp: 30 g, Rfl: 0   COVID-19 At Home Antigen Test KIT, Use as directed., Disp: 1 each, Rfl: 0   fluconazole (DIFLUCAN) 150 MG tablet, Take 1 tablet (150 mg total) by mouth every 3 (three) days as needed., Disp: 2 tablet, Rfl: 0   methylPREDNISolone (MEDROL DOSEPAK) 4 MG TBPK tablet, Take as directed on packet, Disp: 21 tablet, Rfl: 0   ondansetron (ZOFRAN-ODT) 4 MG disintegrating tablet, Take 1 tablet (4 mg total) by mouth every 8 (eight) hours as needed for nausea or vomiting., Disp: 20 tablet, Rfl: 0   OZEMPIC, 1 MG/DOSE, 4 MG/3ML SOPN, Inject into the skin., Disp: , Rfl:   Observations/Objective: Patient is well-developed, well-nourished in no acute distress.  Resting comfortably at home.  Head is normocephalic, atraumatic.  No labored breathing.  Speech is clear and coherent with logical content.  Patient is alert and oriented at baseline.  Right eye is mildly injected, no discharge at present noted  Assessment and Plan: 1. Bacterial conjunctivitis of right eye (Primary) - moxifloxacin (VIGAMOX) 0.5 % ophthalmic solution; Place 1 drop into the right eye 3 (three) times daily.  Dispense: 3 mL; Refill: 0  2. Bacterial upper respiratory infection - azithromycin (ZITHROMAX) 250 MG tablet; Take 2 tablets on day 1, then 1 tablet daily on days 2 through 5  Dispense: 6 tablet; Refill: 0  - Suspect bacterial conjunctivitis due to using old make-up - Vigamox prescription refilled to complete a 5 day treatment - Zpack added for continued URI symptoms with cough,  nasal congestion, and sore throat (failed Augmentin) - Warm compresses to affected eye - Good hand hygiene - Seek in person evaluation if symptoms worsen or fail to improve   Follow Up Instructions: I discussed the assessment and treatment plan with the patient. The patient was provided an opportunity to ask questions and all were answered. The patient agreed with the plan and demonstrated an understanding of the instructions.  A copy of instructions were sent to the patient via MyChart unless otherwise noted below.    The patient was advised to call back or seek an in-person evaluation if the symptoms worsen or if the condition fails to improve as anticipated.    [name], PA-C

## 2023-11-05 NOTE — Patient Instructions (Signed)
[name], thank you for joining [name], PA-C for today's virtual visit.  While this provider is not your primary care provider (PCP), if your PCP is located in our provider database this encounter information will be shared with them immediately following your visit.   A [facility] MyChart account gives you access to today's visit and all your visits, tests, and labs performed at [facility] " click here if you don't have a [facility] MyChart account or go to [email or web address]  Consent: (Patient) Kathryn Williams provided verbal consent for this virtual visit at the beginning of the encounter.  Current Medications:  Current Outpatient Medications:    azithromycin (ZITHROMAX) 250 MG tablet, Take 2 tablets on day 1, then 1 tablet daily on days 2 through 5, Disp: 6 tablet, Rfl: 0   moxifloxacin (VIGAMOX) 0.5 % ophthalmic solution, Place 1 drop into the right eye 3 (three) times daily., Disp: 3 mL, Rfl: 0   amoxicillin-clavulanate (AUGMENTIN) 875-125 MG tablet, Take 1 tablet by mouth 2 (two) times daily., Disp: 14 tablet, Rfl: 0   benzonatate (TESSALON) 100 MG capsule, Take 1 capsule (100 mg total) by mouth 2 (two) times daily as needed for cough., Disp: 30 capsule, Rfl: 0   benzonatate (TESSALON) 200 MG capsule, Take 1 capsule (200 mg total) by mouth 2 (two) times daily as needed for cough., Disp: 20 capsule, Rfl: 0   clobetasol ointment (TEMOVATE) 0.05 %, APPLY TO AFFECTED AREA TWICE A DAY, Disp: 30 g, Rfl: 0   COVID-19 At Home Antigen Test KIT, Use as directed., Disp: 1 each, Rfl: 0   fluconazole (DIFLUCAN) 150 MG tablet, Take 1 tablet (150 mg total) by mouth every 3 (three) days as needed., Disp: 2 tablet, Rfl: 0   methylPREDNISolone (MEDROL DOSEPAK) 4 MG TBPK tablet, Take as directed on packet, Disp: 21 tablet, Rfl: 0   ondansetron (ZOFRAN-ODT) 4 MG disintegrating tablet, Take 1 tablet (4 mg total) by mouth every 8 (eight) hours as needed for nausea or vomiting.,  Disp: 20 tablet, Rfl: 0   OZEMPIC, 1 MG/DOSE, 4 MG/3ML SOPN, Inject into the skin., Disp: , Rfl:    Medications ordered in this encounter:  Meds ordered this encounter  Medications   moxifloxacin (VIGAMOX) 0.5 % ophthalmic solution    Sig: Place 1 drop into the right eye 3 (three) times daily.    Dispense:  3 mL    Refill:  0    Supervising Provider:   Merrilee Jansky [[id number]]   azithromycin (ZITHROMAX) 250 MG tablet    Sig: Take 2 tablets on day 1, then 1 tablet daily on days 2 through 5    Dispense:  6 tablet    Refill:  0    Supervising Provider:   Merrilee Jansky [phone]     *If you need refills on other medications prior to your next appointment, please contact your pharmacy*  Follow-Up: Call back or seek an in-person evaluation if the symptoms worsen or if the condition fails to improve as anticipated.  [facility] [phone]  Other Instructions Bacterial Conjunctivitis, Adult Bacterial conjunctivitis is an infection of the clear membrane that covers the white part of the eye and the inner surface of the eyelid (conjunctiva). When the blood vessels in the conjunctiva become inflamed, the eye becomes red or pink. The eye often feels irritated or itchy. Bacterial conjunctivitis spreads easily from person to person (is contagious). It also spreads easily from one eye to the  other eye. What are the causes? This condition is caused by bacteria. You may get the infection if you come into close contact with: A person who is infected with the bacteria. Items that are contaminated with the bacteria, such as a face towel, contact lens solution, or eye makeup. What increases the risk? You are more likely to develop this condition if: You are exposed to other people who have the infection. You wear contact lenses. You have a sinus infection. You have had a recent eye injury or surgery. You have a weak body defense system (immune system). You have a medical  condition that causes dry eyes. What are the signs or symptoms? Symptoms of this condition include: Thick, yellowish discharge from the eye. This may turn into a crust on the eyelid overnight and cause your eyelids to stick together. Tearing or watery eyes. Itchy eyes. Burning feeling in your eyes. Eye redness. Swollen eyelids. Blurred vision. How is this diagnosed? This condition is diagnosed based on your symptoms and medical history. Your health care provider may also take a sample of discharge from your eye to find the cause of your infection. How is this treated? This condition may be treated with: Antibiotic eye drops or ointment to clear the infection more quickly and prevent the spread of infection to others. Antibiotic medicines taken by mouth (orally) to treat infections that do not respond to drops or ointments or that last longer than 10 days. Cool, wet cloths (cool compresses) placed on the eyes. Artificial tears applied 2-6 times a day. Follow these instructions at home: Medicines Take or apply your antibiotic medicine as told by your health care provider. Do not stop using the antibiotic, even if your condition improves, unless directed by your health care provider. Take or apply over-the-counter and prescription medicines only as told by your health care provider. Be very careful to avoid touching the edge of your eyelid with the eye-drop bottle or the ointment tube when you apply medicines to the affected eye. This will keep you from spreading the infection to your other eye or to other people. Managing discomfort Gently wipe away any drainage from your eye with a warm, wet washcloth or a cotton ball. Apply a clean, cool compress to your eye for 10-20 minutes, 3-4 times a day. General instructions Do not wear contact lenses until the inflammation is gone and your health care provider says it is safe to wear them again. Ask your health care provider how to sterilize or  replace your contact lenses before you use them again. Wear glasses until you can resume wearing contact lenses. Avoid wearing eye makeup until the inflammation is gone. Throw away any old eye cosmetics that may be contaminated. Change or wash your pillowcase every day. Do not share towels or washcloths. This may spread the infection. Wash your hands often with soap and water for at least 20 seconds and especially before touching your face or eyes. Use paper towels to dry your hands. Avoid touching or rubbing your eyes. Do not drive or use heavy machinery if your vision is blurred. Contact a health care provider if: You have a fever. Your symptoms do not get better after 10 days. Get help right away if: You have a fever and your symptoms suddenly get worse. You have severe pain when you move your eye. You have facial pain, redness, or swelling. You have a sudden loss of vision. Summary Bacterial conjunctivitis is an infection of the clear membrane that covers  the white part of the eye and the inner surface of the eyelid (conjunctiva). Bacterial conjunctivitis spreads easily from eye to eye and from person to person (is contagious). Wash your hands often with soap and water for at least 20 seconds and especially before touching your face or eyes. Use paper towels to dry your hands. Take or apply your antibiotic medicine as told by your health care provider. Do not stop using the antibiotic even if your condition improves. Contact a health care provider if you have a fever or if your symptoms do not get better after 10 days. Get help right away if you have a sudden loss of vision. This information is not intended to replace advice given to you by your health care provider. Make sure you discuss any questions you have with your health care provider. Document Revised: 02/16/2021 Document Reviewed: 02/16/2021 Elsevier Patient Education  2024 Elsevier Inc.    If you have been instructed to have  an in-person evaluation today at a local Urgent Care facility, please use the link below. It will take you to a list of all of our available [facility], including address, phone number and hours of operation. Please do not delay care.  [facility]  If you or a family member do not have a primary care provider, use the link below to schedule a visit and establish care. When you choose a [facility] primary care physician or advanced practice provider, you gain a long-term partner in health. Find a Primary Care Provider  Learn more about [facility]'s in-office and virtual care options: [facility] Get Care Now

## 2024-06-20 ENCOUNTER — Telehealth: Payer: PRIVATE HEALTH INSURANCE | Admitting: Physician Assistant

## 2024-06-20 DIAGNOSIS — J3089 Other allergic rhinitis: Secondary | ICD-10-CM

## 2024-06-20 MED ORDER — LEVOCETIRIZINE DIHYDROCHLORIDE 5 MG PO TABS: 5.0000 mg | ORAL_TABLET | Freq: Every evening | ORAL | 0 refills | Status: AC

## 2024-06-20 MED ORDER — OLOPATADINE HCL 0.1 % OP SOLN: 1.0000 [drp] | Freq: Two times a day (BID) | OPHTHALMIC | 0 refills | Status: AC

## 2024-06-20 NOTE — Progress Notes (Signed)
 Virtual Visit Consent   Kathryn Williams, you are scheduled for a virtual visit with a [facility] provider today. Just as with appointments in the office, your consent must be obtained to participate. Your consent will be active for this visit and any virtual visit you may have with one of our providers in the next 365 days. If you have a MyChart account, a copy of this consent can be sent to you electronically.  As this is a virtual visit, video technology does not allow for your provider to perform a traditional examination. This may limit your provider's ability to fully assess your condition. If your provider identifies any concerns that need to be evaluated in person or the need to arrange testing (such as labs, EKG, etc.), we will make arrangements to do so. Although advances in technology are sophisticated, we cannot ensure that it will always work on either your end or our end. If the connection with a video visit is poor, the visit may have to be switched to a telephone visit. With either a video or telephone visit, we are not always able to ensure that we have a secure connection.  By engaging in this virtual visit, you consent to the provision of healthcare and authorize for your insurance to be billed (if applicable) for the services provided during this visit. Depending on your insurance coverage, you may receive a charge related to this service.  I need to obtain your verbal consent now. Are you willing to proceed with your visit today? Kathryn Williams has provided verbal consent on 06/20/2024 for a virtual visit (video or telephone). Teena Williams, [place]  Date: 06/20/2024 2:30 PM   Virtual Visit via Video Note   I, Kathryn Williams, connected with  Kathryn Williams  ([id number], 06-26-1981) on 06/20/24 at  2:15 PM EDT by a video-enabled telemedicine application and verified that I am speaking with the correct person using two identifiers.  Location: Patient: Virtual Visit Location Patient: Home Provider:  Virtual Visit Location Provider: Home Office   I discussed the limitations of evaluation and management by telemedicine and the availability of in person appointments. The patient expressed understanding and agreed to proceed.    History of Present Illness: Kathryn Williams is a 43 y.o. who identifies as a female who was assigned female at birth, and is being seen today for ongoing itchy and watery eyes. SABRA  HPI: Eye Problem  Both eyes are affected. This is a new problem. The current episode started in the past 7 days. The problem has been unchanged. The patient is experiencing no pain. There is No known exposure to pink eye. She Does not wear contacts. Associated symptoms include an eye discharge and itching. Associated symptoms comments: puffy. She has tried nothing for the symptoms. The treatment provided mild relief.    Problems:  Patient Active Problem List   Diagnosis Date Noted   Vitamin D  insufficiency 05/09/2022    Allergies: No Known Allergies Medications:  Current Outpatient Medications:    amoxicillin -clavulanate (AUGMENTIN ) 875-125 MG tablet, Take 1 tablet by mouth 2 (two) times daily., Disp: 14 tablet, Rfl: 0   benzonatate  (TESSALON ) 100 MG capsule, Take 1 capsule (100 mg total) by mouth 2 (two) times daily as needed for cough., Disp: 30 capsule, Rfl: 0   benzonatate  (TESSALON ) 200 MG capsule, Take 1 capsule (200 mg total) by mouth 2 (two) times daily as needed for cough., Disp: 20 capsule, Rfl: 0   clobetasol  ointment (TEMOVATE ) 0.05 %, APPLY TO AFFECTED  AREA TWICE A DAY, Disp: 30 g, Rfl: 0   COVID-19 At Home Antigen Test KIT, Use as directed., Disp: 1 each, Rfl: 0   fluconazole  (DIFLUCAN ) 150 MG tablet, Take 1 tablet (150 mg total) by mouth every 3 (three) days as needed., Disp: 2 tablet, Rfl: 0   methylPREDNISolone  (MEDROL  DOSEPAK) 4 MG TBPK tablet, Take as directed on packet, Disp: 21 tablet, Rfl: 0   moxifloxacin  (VIGAMOX ) 0.5 % ophthalmic solution, Place 1 drop into the right eye  3 (three) times daily., Disp: 3 mL, Rfl: 0   ondansetron  (ZOFRAN -ODT) 4 MG disintegrating tablet, Take 1 tablet (4 mg total) by mouth every 8 (eight) hours as needed for nausea or vomiting., Disp: 20 tablet, Rfl: 0   OZEMPIC, 1 MG/DOSE, 4 MG/3ML SOPN, Inject into the skin., Disp: , Rfl:   Observations/Objective: Patient is well-developed, well-nourished in no acute distress.  Resting comfortably  at home.  Head is normocephalic, atraumatic.  No labored breathing.  Speech is clear and coherent with logical content.  Patient is alert and oriented at baseline.  Mildly puffy lower eyelid allergic  Assessment and Plan: 1. Environmental and seasonal allergies (Primary)  Patient presenting with itchy eyes and allergic.  Presentation consistent with allergies.  Patient's vision is grossly intact.  EOMI.  In addition to viral conjunctivitis, I also considered allergic and allergic conjunctivitis, corneal abrasion, retained eye foreign body, corneal ulcer, glaucoma, open globe but this appears less likely considering history, physical exam, and data gathered.  Patient provided with prescription for eye drops.     I have instructed the patient to report to the ER at any time if there are any new or worsening symptoms. The patient expressed understanding of and agreement with this plan.  Follow Up Instructions: I discussed the assessment and treatment plan with the patient. The patient was provided an opportunity to ask questions and all were answered. The patient agreed with the plan and demonstrated an understanding of the instructions.  A copy of instructions were sent to the patient via MyChart unless otherwise noted below.    The patient was advised to call back or seek an in-person evaluation if the symptoms worsen or if the condition fails to improve as anticipated.    [name], PA-C

## 2024-06-20 NOTE — Patient Instructions (Signed)
  Kathryn Williams, thank you for joining [name], PA-C for today's virtual visit.  While this provider is not your primary care provider (PCP), if your PCP is located in our provider database this encounter information will be shared with them immediately following your visit.   A [facility] MyChart account gives you access to today's visit and all your visits, tests, and labs performed at [facility]  click here if you don't have a [facility] MyChart account or go to [email or web address]  Consent: (Patient) Kathryn Williams provided verbal consent for this virtual visit at the beginning of the encounter.  Current Medications:  Current Outpatient Medications:    amoxicillin -clavulanate (AUGMENTIN ) 875-125 MG tablet, Take 1 tablet by mouth 2 (two) times daily., Disp: 14 tablet, Rfl: 0   benzonatate  (TESSALON ) 100 MG capsule, Take 1 capsule (100 mg total) by mouth 2 (two) times daily as needed for cough., Disp: 30 capsule, Rfl: 0   benzonatate  (TESSALON ) 200 MG capsule, Take 1 capsule (200 mg total) by mouth 2 (two) times daily as needed for cough., Disp: 20 capsule, Rfl: 0   clobetasol  ointment (TEMOVATE ) 0.05 %, APPLY TO AFFECTED AREA TWICE A DAY, Disp: 30 g, Rfl: 0   COVID-19 At Home Antigen Test KIT, Use as directed., Disp: 1 each, Rfl: 0   fluconazole  (DIFLUCAN ) 150 MG tablet, Take 1 tablet (150 mg total) by mouth every 3 (three) days as needed., Disp: 2 tablet, Rfl: 0   methylPREDNISolone  (MEDROL  DOSEPAK) 4 MG TBPK tablet, Take as directed on packet, Disp: 21 tablet, Rfl: 0   moxifloxacin  (VIGAMOX ) 0.5 % ophthalmic solution, Place 1 drop into the right eye 3 (three) times daily., Disp: 3 mL, Rfl: 0   ondansetron  (ZOFRAN -ODT) 4 MG disintegrating tablet, Take 1 tablet (4 mg total) by mouth every 8 (eight) hours as needed for nausea or vomiting., Disp: 20 tablet, Rfl: 0   OZEMPIC, 1 MG/DOSE, 4 MG/3ML SOPN, Inject into the skin., Disp: , Rfl:    Medications ordered in this  encounter:  No orders of the defined types were placed in this encounter.    *If you need refills on other medications prior to your next appointment, please contact your pharmacy*  Follow-Up: Call back or seek an in-person evaluation if the symptoms worsen or if the condition fails to improve as anticipated.  [facility] [phone]  Other Instructions Please report to the nearest Emergency room with any worsening symptoms. Follow up with primary care provider (PCP) in 2 -3 days.    If you have been instructed to have an in-person evaluation today at a local Urgent Care facility, please use the link below. It will take you to a list of all of our available [facility], including address, phone number and hours of operation. Please do not delay care.  [facility]  If you or a family member do not have a primary care provider, use the link below to schedule a visit and establish care. When you choose a [facility] primary care physician or advanced practice provider, you gain a long-term partner in health. Find a Primary Care Provider  Learn more about [facility]'s in-office and virtual care options: [facility] Get Care Now

## 2024-08-13 ENCOUNTER — Telehealth: Payer: PRIVATE HEALTH INSURANCE | Admitting: Physician Assistant

## 2024-08-13 DIAGNOSIS — J0140 Acute pansinusitis, unspecified: Secondary | ICD-10-CM

## 2024-08-13 MED ORDER — METHYLPREDNISOLONE 4 MG PO TBPK: ORAL_TABLET | ORAL | 0 refills | Status: AC

## 2024-08-13 NOTE — Progress Notes (Signed)
 Virtual Visit Consent   Kathryn Williams, you are scheduled for a virtual visit with a Kathryn Williams provider today. Just as with appointments in the office, your consent must be obtained to participate. Your consent will be active for this visit and any virtual visit you may have with one of our providers in the next 365 days. If you have a MyChart account, a copy of this consent can be sent to you electronically.  As this is a virtual visit, video technology does not allow for your provider to perform a traditional examination. This may limit your provider's ability to fully assess your condition. If your provider identifies any concerns that need to be evaluated in person or the need to arrange testing (such as labs, EKG, etc.), we will make arrangements to do so. Although advances in technology are sophisticated, we cannot ensure that it will always work on either your end or our end. If the connection with a video visit is poor, the visit may have to be switched to a telephone visit. With either a video or telephone visit, we are not always able to ensure that we have a secure connection.  By engaging in this virtual visit, you consent to the provision of healthcare and authorize for your insurance to be billed (if applicable) for the services provided during this visit. Depending on your insurance coverage, you may receive a charge related to this service.  I need to obtain your verbal consent now. Are you willing to proceed with your visit today? [name] has provided verbal consent on 08/13/2024 for a virtual visit (video or telephone). [name], [place]  Date: 08/13/2024 5:37 PM   Virtual Visit via Video Note   I, Kathryn Williams, connected with  [name]  ([id number], 06/11/1981) on 08/13/24 at  5:30 PM EDT by a video-enabled telemedicine application and verified that I am speaking with the correct person using two identifiers.  Location: Patient: Virtual Visit Location Patient: Home Provider:  Virtual Visit Location Provider: Home Office   I discussed the limitations of evaluation and management by telemedicine and the availability of in person appointments. The patient expressed understanding and agreed to proceed.    History of Present Illness: Kathryn Williams is a 43 y.o. who identifies as a female who was assigned female at birth, and is being seen today for sinus pain.  HPI: 43y/o F presents for a telehealth video visit for c/o congestion, sinus pressure, pain, post nasal drainage, and cough x 2 days. Denies fever, SOB, CP. Husband with similar symptoms, but feeling better after one day. Has been using Allegra, zyrtec, Montelukast, nasal spray without much relief.   Sinusitis    Problems:  Patient Active Problem List   Diagnosis Date Noted   Vitamin D  insufficiency 05/09/2022    Allergies: No Known Allergies Medications:  Current Outpatient Medications:    methylPREDNISolone  (MEDROL  DOSEPAK) 4 MG TBPK tablet, Take as directed, Disp: 1 each, Rfl: 0   amoxicillin -clavulanate (AUGMENTIN ) 875-125 MG tablet, Take 1 tablet by mouth 2 (two) times daily., Disp: 14 tablet, Rfl: 0   benzonatate  (TESSALON ) 100 MG capsule, Take 1 capsule (100 mg total) by mouth 2 (two) times daily as needed for cough., Disp: 30 capsule, Rfl: 0   benzonatate  (TESSALON ) 200 MG capsule, Take 1 capsule (200 mg total) by mouth 2 (two) times daily as needed for cough., Disp: 20 capsule, Rfl: 0   clobetasol  ointment (TEMOVATE ) 0.05 %, APPLY TO AFFECTED AREA TWICE A DAY, Disp: 30  g, Rfl: 0   COVID-19 At Home Antigen Test KIT, Use as directed., Disp: 1 each, Rfl: 0   fluconazole  (DIFLUCAN ) 150 MG tablet, Take 1 tablet (150 mg total) by mouth every 3 (three) days as needed., Disp: 2 tablet, Rfl: 0   levocetirizine (XYZAL ) 5 MG tablet, Take 1 tablet (5 mg total) by mouth every evening for 14 days., Disp: 14 tablet, Rfl: 0   moxifloxacin  (VIGAMOX ) 0.5 % ophthalmic solution, Place 1 drop into the right eye 3 (three)  times daily., Disp: 3 mL, Rfl: 0   ondansetron  (ZOFRAN -ODT) 4 MG disintegrating tablet, Take 1 tablet (4 mg total) by mouth every 8 (eight) hours as needed for nausea or vomiting., Disp: 20 tablet, Rfl: 0   OZEMPIC, 1 MG/DOSE, 4 MG/3ML SOPN, Inject into the skin., Disp: , Rfl:   Observations/Objective: Patient is well-developed, well-nourished in no acute distress.  Resting comfortably  at home.  Head is normocephalic, atraumatic.  No labored breathing.  Speech is clear and coherent with logical content.  Patient is alert and oriented at baseline.    Assessment and Plan: 1. Acute non-recurrent pansinusitis (Primary) - methylPREDNISolone  (MEDROL  DOSEPAK) 4 MG TBPK tablet; Take as directed  Dispense: 1 each; Refill: 0  Start oral steroids as prescribed. Increase fluids May use otc cough suppressants as directed on the box. Deferred oral antibiotic at the present time.  Continue with previously prescribed nasal spray.  Consider Zaditor antihistamine eye drops for itchy eyes. Continue to watch for worsening symptoms. Schedule a virtual appointment or follow up at an urgent care clinic if symptoms don't improve.  Pt verbalized understanding and in agreement.    Follow Up Instructions: I discussed the assessment and treatment plan with the patient. The patient was provided an opportunity to ask questions and all were answered. The patient agreed with the plan and demonstrated an understanding of the instructions.  A copy of instructions were sent to the patient via MyChart unless otherwise noted below.   Patient has requested to receive PHI (AVS, Work Notes, etc) pertaining to this video visit through e-mail as they are currently without active MyChart. They have voiced understand that email is not considered secure and their health information could be viewed by someone other than the patient.   The patient was advised to call back or seek an in-person evaluation if the symptoms worsen or  if the condition fails to improve as anticipated.    Kathryn Breau, PA-C

## 2024-08-13 NOTE — Patient Instructions (Signed)
 Kathryn Williams, thank you for joining [name], PA-C for today's virtual visit.  While this provider is not your primary care provider (PCP), if your PCP is located in our provider database this encounter information will be shared with them immediately following your visit.   [facility] MyChart account gives you access to today's visit and all your visits, tests, and labs performed at [facility]  click here if you don't have a [facility] MyChart account or go to [email or web address]  Consent: (Patient) Kathryn Williams provided verbal consent for this virtual visit at the beginning of the encounter.  Current Medications:  Current Outpatient Medications:    methylPREDNISolone  (MEDROL  DOSEPAK) 4 MG TBPK tablet, Take as directed, Disp: 1 each, Rfl: 0   amoxicillin -clavulanate (AUGMENTIN ) 875-125 MG tablet, Take 1 tablet by mouth 2 (two) times daily., Disp: 14 tablet, Rfl: 0   benzonatate  (TESSALON ) 100 MG capsule, Take 1 capsule (100 mg total) by mouth 2 (two) times daily as needed for cough., Disp: 30 capsule, Rfl: 0   benzonatate  (TESSALON ) 200 MG capsule, Take 1 capsule (200 mg total) by mouth 2 (two) times daily as needed for cough., Disp: 20 capsule, Rfl: 0   clobetasol  ointment (TEMOVATE ) 0.05 %, APPLY TO AFFECTED AREA TWICE A DAY, Disp: 30 g, Rfl: 0   COVID-19 At Home Antigen Test KIT, Use as directed., Disp: 1 each, Rfl: 0   fluconazole  (DIFLUCAN ) 150 MG tablet, Take 1 tablet (150 mg total) by mouth every 3 (three) days as needed., Disp: 2 tablet, Rfl: 0   levocetirizine (XYZAL ) 5 MG tablet, Take 1 tablet (5 mg total) by mouth every evening for 14 days., Disp: 14 tablet, Rfl: 0   moxifloxacin  (VIGAMOX ) 0.5 % ophthalmic solution, Place 1 drop into the right eye 3 (three) times daily., Disp: 3 mL, Rfl: 0   ondansetron  (ZOFRAN -ODT) 4 MG disintegrating tablet, Take 1 tablet (4 mg total) by mouth every 8 (eight) hours as needed for nausea or vomiting., Disp: 20 tablet, Rfl: 0    OZEMPIC, 1 MG/DOSE, 4 MG/3ML SOPN, Inject into the skin., Disp: , Rfl:    Medications ordered in this encounter:  Meds ordered this encounter  Medications   methylPREDNISolone  (MEDROL  DOSEPAK) 4 MG TBPK tablet    Sig: Take as directed    Dispense:  1 each    Refill:  0    Supervising Provider:   BLAISE ALEENE KIDD [[id number]]     *If you need refills on other medications prior to your next appointment, please contact your pharmacy*  Follow-Up: Call back or seek an in-person evaluation if the symptoms worsen or if the condition fails to improve as anticipated.  [facility] [phone]  Other Instructions Start oral steroids as prescribed. Increase fluids May use otc cough suppressants as directed on the box. Consider Zaditor antihistamine eye drops for itchy eyes. Continue to watch for worsening symptoms. Schedule a virtual appointment or follow up at an urgent care clinic if symptoms don't improve.    If you have been instructed to have an in-person evaluation today at a local Urgent Care facility, please use the link below. It will take you to a list of all of our available [facility], including address, phone number and hours of operation. Please do not delay care.  [facility]  If you or a family member do not have a primary care provider, use the link below to schedule a visit and establish care.  When you choose a [facility] primary care physician or advanced practice provider, you gain a long-term partner in health. Find a Primary Care Provider  Learn more about [facility]'s in-office and virtual care options:  - Get Care Now
